# Patient Record
Sex: Female | Born: 1950 | Race: White | Hispanic: No | Marital: Married | Smoking: Former smoker
Health system: Southern US, Community
[De-identification: ages and names within clinical notes are randomized; demographics above are authoritative.]

## PROBLEM LIST (undated history)

## (undated) DIAGNOSIS — C801 Malignant (primary) neoplasm, unspecified: Secondary | ICD-10-CM

## (undated) DIAGNOSIS — M7061 Trochanteric bursitis, right hip: Secondary | ICD-10-CM

## (undated) DIAGNOSIS — R7303 Prediabetes: Secondary | ICD-10-CM

## (undated) DIAGNOSIS — K635 Polyp of colon: Secondary | ICD-10-CM

## (undated) DIAGNOSIS — D649 Anemia, unspecified: Secondary | ICD-10-CM

## (undated) DIAGNOSIS — M7062 Trochanteric bursitis, left hip: Secondary | ICD-10-CM

## (undated) DIAGNOSIS — H269 Unspecified cataract: Secondary | ICD-10-CM

## (undated) DIAGNOSIS — E785 Hyperlipidemia, unspecified: Secondary | ICD-10-CM

## (undated) DIAGNOSIS — M1990 Unspecified osteoarthritis, unspecified site: Secondary | ICD-10-CM

## (undated) HISTORY — DX: Malignant (primary) neoplasm, unspecified: C80.1

## (undated) HISTORY — DX: Unspecified osteoarthritis, unspecified site: M19.90

## (undated) HISTORY — DX: Hyperlipidemia, unspecified: E78.5

## (undated) HISTORY — DX: Polyp of colon: K63.5

## (undated) HISTORY — PX: CHOLECYSTECTOMY: SHX55

## (undated) HISTORY — DX: Unspecified cataract: H26.9

## (undated) HISTORY — PX: COLONOSCOPY: SHX174

## (undated) HISTORY — DX: Prediabetes: R73.03

## (undated) HISTORY — DX: Anemia, unspecified: D64.9

---
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
## 1976-07-29 HISTORY — PX: CERVICAL CONIZATION W/BX: SHX1330

## 2006-07-29 HISTORY — PX: SKIN CANCER DESTRUCTION: SHX778

## 2010-11-15 ENCOUNTER — Emergency Department (HOSPITAL_COMMUNITY)
Admission: EM | Admit: 2010-11-15 | Discharge: 2010-11-15 | Disposition: A | Discharge status: Home / Self Care | Payer: 59 | Attending: Emergency Medicine | Admitting: Emergency Medicine

## 2010-11-15 ENCOUNTER — Emergency Department (HOSPITAL_COMMUNITY): Payer: 59

## 2010-11-15 DIAGNOSIS — K8020 Calculus of gallbladder without cholecystitis without obstruction: Secondary | ICD-10-CM | POA: Insufficient documentation

## 2010-11-15 DIAGNOSIS — E7800 Pure hypercholesterolemia, unspecified: Secondary | ICD-10-CM | POA: Insufficient documentation

## 2010-11-15 DIAGNOSIS — R109 Unspecified abdominal pain: Secondary | ICD-10-CM | POA: Insufficient documentation

## 2010-11-15 LAB — LIPASE, BLOOD: Lipase: 36 U/L (ref 11–59)

## 2010-11-15 LAB — COMPREHENSIVE METABOLIC PANEL
ALT: 22 U/L (ref 0–35)
Albumin: 4 g/dL (ref 3.5–5.2)
Alkaline Phosphatase: 78 U/L (ref 39–117)
Chloride: 107 mEq/L (ref 96–112)
Glucose, Bld: 131 mg/dL — ABNORMAL HIGH (ref 70–99)
Potassium: 4 mEq/L (ref 3.5–5.1)
Sodium: 139 mEq/L (ref 135–145)
Total Protein: 7.1 g/dL (ref 6.0–8.3)

## 2010-11-15 LAB — CBC
MCH: 31.2 pg (ref 26.0–34.0)
MCHC: 35.5 g/dL (ref 30.0–36.0)
MCV: 88 fL (ref 78.0–100.0)
Platelets: 266 10*3/uL (ref 150–400)
RBC: 4.65 MIL/uL (ref 3.87–5.11)
RDW: 12 % (ref 11.5–15.5)

## 2010-11-15 LAB — DIFFERENTIAL
Basophils Relative: 1 % (ref 0–1)
Eosinophils Absolute: 0.2 10*3/uL (ref 0.0–0.7)
Eosinophils Relative: 1 % (ref 0–5)
Lymphs Abs: 2.5 10*3/uL (ref 0.7–4.0)
Monocytes Absolute: 0.9 10*3/uL (ref 0.1–1.0)
Monocytes Relative: 8 % (ref 3–12)

## 2010-11-15 LAB — URINALYSIS, ROUTINE W REFLEX MICROSCOPIC
Bilirubin Urine: NEGATIVE
Ketones, ur: 40 mg/dL — AB
Nitrite: NEGATIVE
Protein, ur: NEGATIVE mg/dL
pH: 5 (ref 5.0–8.0)

## 2010-11-15 LAB — URINE MICROSCOPIC-ADD ON

## 2010-11-26 ENCOUNTER — Other Ambulatory Visit: Payer: Self-pay | Admitting: Surgery

## 2010-11-26 ENCOUNTER — Ambulatory Visit
Admission: RE | Admit: 2010-11-26 | Discharge: 2010-11-26 | Disposition: A | Discharge status: Home / Self Care | Payer: 59 | Source: Ambulatory Visit | Attending: Surgery | Admitting: Surgery

## 2010-11-26 DIAGNOSIS — Z01811 Encounter for preprocedural respiratory examination: Secondary | ICD-10-CM

## 2011-01-28 ENCOUNTER — Encounter: Payer: Self-pay | Admitting: Family Medicine

## 2011-01-28 DIAGNOSIS — E785 Hyperlipidemia, unspecified: Secondary | ICD-10-CM | POA: Insufficient documentation

## 2011-03-19 ENCOUNTER — Other Ambulatory Visit: Payer: Self-pay | Admitting: Family Medicine

## 2011-03-19 ENCOUNTER — Encounter: Payer: Self-pay | Admitting: Gastroenterology

## 2011-03-19 DIAGNOSIS — Z1231 Encounter for screening mammogram for malignant neoplasm of breast: Secondary | ICD-10-CM

## 2011-03-28 ENCOUNTER — Ambulatory Visit (AMBULATORY_SURGERY_CENTER): Payer: 59 | Admitting: *Deleted

## 2011-03-28 ENCOUNTER — Encounter: Payer: Self-pay | Admitting: Gastroenterology

## 2011-03-28 VITALS — Ht 63.0 in | Wt 181.0 lb

## 2011-03-28 DIAGNOSIS — Z1211 Encounter for screening for malignant neoplasm of colon: Secondary | ICD-10-CM

## 2011-03-28 MED ORDER — SUPREP BOWEL PREP KIT 17.5-3.13-1.6 GM/177ML PO SOLN: 1.0000 | ORAL | Status: DC

## 2011-04-03 ENCOUNTER — Telehealth: Payer: Self-pay | Admitting: Gastroenterology

## 2011-04-03 ENCOUNTER — Ambulatory Visit
Admission: RE | Admit: 2011-04-03 | Discharge: 2011-04-03 | Disposition: A | Discharge status: Home / Self Care | Payer: 59 | Source: Ambulatory Visit | Attending: Family Medicine | Admitting: Family Medicine

## 2011-04-03 DIAGNOSIS — Z1231 Encounter for screening mammogram for malignant neoplasm of breast: Secondary | ICD-10-CM

## 2011-04-03 NOTE — Telephone Encounter (Signed)
Pt wants to know if the fact that she has been taking prednisone for poison ivy is going to matter with her upcoming colon. She is on her second round. Colon is scheduled for next week. Please advise.

## 2011-04-04 NOTE — Telephone Encounter (Signed)
No problem.

## 2011-04-04 NOTE — Telephone Encounter (Signed)
Pt aware.

## 2011-04-11 ENCOUNTER — Other Ambulatory Visit: Payer: 59 | Admitting: Gastroenterology

## 2011-04-23 ENCOUNTER — Ambulatory Visit (AMBULATORY_SURGERY_CENTER): Payer: 59 | Admitting: Gastroenterology

## 2011-04-23 ENCOUNTER — Encounter: Payer: Self-pay | Admitting: Gastroenterology

## 2011-04-23 VITALS — BP 155/77 | HR 77 | Temp 97.0°F | Resp 18 | Ht 60.0 in | Wt 181.0 lb

## 2011-04-23 DIAGNOSIS — D126 Benign neoplasm of colon, unspecified: Secondary | ICD-10-CM

## 2011-04-23 DIAGNOSIS — K5730 Diverticulosis of large intestine without perforation or abscess without bleeding: Secondary | ICD-10-CM

## 2011-04-23 DIAGNOSIS — Z1211 Encounter for screening for malignant neoplasm of colon: Secondary | ICD-10-CM

## 2011-04-23 MED ORDER — SODIUM CHLORIDE 0.9 % IV SOLN: 500.0000 mL | INTRAVENOUS | Status: DC

## 2011-04-23 NOTE — Patient Instructions (Addendum)
Diverticulosis Diverticulosis is a common condition that develops when small pouches (diverticula) form in the wall of the colon. The risk of diverticulosis increases with age. It happens more often in people who eat a low-fiber diet. Most individuals with diverticulosis have no symptoms. Those individuals with symptoms usually experience belly (abdominal) pain, constipation, or loose stools (diarrhea). HOME CARE INSTRUCTIONS  Increase the amount of fiber in your diet as directed by your caregiver or dietician. This may reduce symptoms of diverticulosis.   Your caregiver may recommend taking a dietary fiber supplement.   Drink at least 6 to 8 glasses of water each day to prevent constipation.   Try not to strain when you have a bowel movement.   Your caregiver may recommend avoiding nuts and seeds to prevent complications, although this is still an uncertain benefit.   Only take over-the-counter or prescription medicines for pain, discomfort, or fever as directed by your caregiver.  FOODS HAVING HIGH FIBER CONTENT INCLUDE:  Fruits. Apple, peach, pear, tangerine, raisins, prunes.   Vegetables. Brussels sprouts, asparagus, broccoli, cabbage, carrot, cauliflower, romaine lettuce, spinach, summer squash, tomato, winter squash, zucchini.   Starchy Vegetables. Baked beans, kidney beans, lima beans, split peas, lentils, potatoes (with skin).   Grains. Whole wheat bread, brown rice, bran flake cereal, plain oatmeal, white rice, shredded wheat, bran muffins.  SEEK IMMEDIATE MEDICAL CARE IF:  You develop increasing pain or severe bloating.   You have an oral temperature above 100, not controlled by medicine.   You develop vomiting or bowel movements that are bloody or black.  Document Released: 04/11/2004 Document Re-Released: 01/02/2010 [facility] Patient Information 2011 [place], [place]  .Polyps, Colon  A polyp is extra tissue that grows inside your body. Colon polyps grow in the large  intestine. The large intestine, also called the colon, is part of your digestive system. It is a long, hollow tube at the end of your digestive tract where your body makes and stores stool. Most polyps are not dangerous. They are benign. This means they are not cancerous. But over time, some types of polyps can turn into cancer. Polyps that are smaller than a pea are usually not harmful. But larger polyps could someday become or may already be cancerous. To be safe, doctors remove all polyps and test them.  WHO GETS POLYPS? Anyone can get polyps, but certain people are more likely than others. You may have a greater chance of getting polyps if:  You are over 50.   You have had polyps before.   Someone in your family has had polyps.   Someone in your family has had cancer of the large intestine.   Find out if someone in your family has had polyps. You may also be more likely to get polyps if you:   Eat a lot of fatty foods   Smoke   Drink alcohol   Do not exercise  Eat too much  SYMPTOMS Most small polyps do not cause symptoms. People often do not know they have one until their caregiver finds it during a regular checkup or while testing them for something else. Some people do have symptoms like these:  Bleeding from the anus. You might notice blood on your underwear or on toilet paper after you have had a bowel movement.   Constipation or diarrhea that lasts more than a week.   Blood in the stool. Blood can make stool look black or it can show up as red streaks in the stool.  If  you have any of these symptoms, see your caregiver. HOW DOES THE DOCTOR TEST FOR POLYPS? The doctor can use four tests to check for polyps:  Digital rectal exam. The caregiver wears gloves and checks your rectum (the last part of the large intestine) to see if it feels normal. This test would find polyps only in the rectum. Your caregiver may need to do one of the other tests listed below to find polyps  higher up in the intestine.   Barium enema. The caregiver puts a liquid called barium into your rectum before taking x-rays of your large intestine. Barium makes your intestine look white in the pictures. Polyps are dark, so they are easy to see.   Sigmoidoscopy. With this test, the caregiver can see inside your large intestine. A thin flexible tube is placed into your rectum. The device is called a sigmoidoscope, which has a light and a tiny video camera in it. The caregiver uses the sigmoidoscope to look at the last third of your large intestine.   Colonoscopy. This test is like sigmoidoscopy, but the caregiver looks at all of the large intestine. It usually requires sedation. This is the most common method for finding and removing polyps.  TREATMENT  The caregiver will remove the polyp during sigmoidoscopy or colonoscopy. The polyp is then tested for cancer.   If you have had polyps, your caregiver may want you to get tested regularly in the future.  PREVENTION There is not one sure way to prevent polyps. You might be able to lower your risk of getting them if you:  Eat more fruits and vegetables and less fatty food.   Do not smoke.   Avoid alcohol.   Exercise every day.   Lose weight if you are overweight.   Eating more calcium and folate can also lower your risk of getting polyps. Some foods that are rich in calcium are milk, cheese, and broccoli. Some foods that are rich in folate are chickpeas, kidney beans, and spinach.   Aspirin might help prevent polyps. Studies are under way.  Document Released: 04/10/2004 Document Re-Released: 01/02/2010 [facility] Patient Information 2011 [place], [place].  Please review discharge instructions (blue and green sheets)  Review information about polyps, diverticulosis and high fiber diets  Resume your normal medications

## 2011-04-23 NOTE — Progress Notes (Signed)
Pt was uncomfortable while the scope was being advanced.  Medications were titated per th MD's orders.  Pt relaxed when the scope was with drawn from the cecum and rested comfortably on the rest of the way out. maw

## 2011-04-24 ENCOUNTER — Telehealth: Payer: Self-pay | Admitting: *Deleted

## 2011-04-24 NOTE — Telephone Encounter (Signed)

## 2012-03-09 ENCOUNTER — Other Ambulatory Visit: Payer: Self-pay | Admitting: Family Medicine

## 2012-03-09 DIAGNOSIS — Z1231 Encounter for screening mammogram for malignant neoplasm of breast: Secondary | ICD-10-CM

## 2012-04-03 ENCOUNTER — Ambulatory Visit
Admission: RE | Admit: 2012-04-03 | Discharge: 2012-04-03 | Disposition: A | Discharge status: Home / Self Care | Payer: 59 | Source: Ambulatory Visit | Attending: Family Medicine | Admitting: Family Medicine

## 2012-04-03 DIAGNOSIS — Z1231 Encounter for screening mammogram for malignant neoplasm of breast: Secondary | ICD-10-CM

## 2013-04-23 ENCOUNTER — Other Ambulatory Visit: Payer: Self-pay

## 2013-04-23 DIAGNOSIS — Z1231 Encounter for screening mammogram for malignant neoplasm of breast: Secondary | ICD-10-CM

## 2013-05-12 ENCOUNTER — Other Ambulatory Visit: Payer: BLUE CROSS/BLUE SHIELD

## 2013-05-12 DIAGNOSIS — Z0000 Encounter for general adult medical examination without abnormal findings: Secondary | ICD-10-CM

## 2013-05-12 LAB — COMPREHENSIVE METABOLIC PANEL
BUN: 18 mg/dL (ref 6–23)
CO2: 28 mEq/L (ref 19–32)
Creat: 0.83 mg/dL (ref 0.50–1.10)
Glucose, Bld: 134 mg/dL — ABNORMAL HIGH (ref 70–99)
Total Bilirubin: 1.1 mg/dL (ref 0.3–1.2)
Total Protein: 6.9 g/dL (ref 6.0–8.3)

## 2013-05-12 LAB — CBC WITH DIFFERENTIAL/PLATELET
Eosinophils Absolute: 0.1 10*3/uL (ref 0.0–0.7)
Eosinophils Relative: 2 % (ref 0–5)
Hemoglobin: 14.3 g/dL (ref 12.0–15.0)
Lymphs Abs: 2.2 10*3/uL (ref 0.7–4.0)
MCH: 30.3 pg (ref 26.0–34.0)
MCV: 88.6 fL (ref 78.0–100.0)
Monocytes Absolute: 0.4 10*3/uL (ref 0.1–1.0)
Monocytes Relative: 6 % (ref 3–12)
RBC: 4.72 MIL/uL (ref 3.87–5.11)

## 2013-05-12 LAB — LIPID PANEL
Cholesterol: 146 mg/dL (ref 0–200)
Total CHOL/HDL Ratio: 2.6 Ratio
Triglycerides: 114 mg/dL (ref <150)
VLDL: 23 mg/dL (ref 0–40)

## 2013-05-13 ENCOUNTER — Ambulatory Visit
Admission: RE | Admit: 2013-05-13 | Discharge: 2013-05-13 | Disposition: A | Discharge status: Home / Self Care | Payer: BC Managed Care – PPO | Source: Ambulatory Visit

## 2013-05-13 DIAGNOSIS — Z1231 Encounter for screening mammogram for malignant neoplasm of breast: Secondary | ICD-10-CM

## 2013-05-19 ENCOUNTER — Encounter: Payer: Self-pay | Admitting: Family Medicine

## 2013-05-20 ENCOUNTER — Encounter: Payer: Self-pay | Admitting: Family Medicine

## 2013-05-20 ENCOUNTER — Ambulatory Visit (INDEPENDENT_AMBULATORY_CARE_PROVIDER_SITE_OTHER): Payer: BLUE CROSS/BLUE SHIELD | Admitting: Family Medicine

## 2013-05-20 VITALS — BP 130/90 | HR 68 | Temp 97.4°F | Resp 18 | Ht 64.0 in | Wt 196.0 lb

## 2013-05-20 DIAGNOSIS — R739 Hyperglycemia, unspecified: Secondary | ICD-10-CM

## 2013-05-20 DIAGNOSIS — Z0000 Encounter for general adult medical examination without abnormal findings: Secondary | ICD-10-CM

## 2013-05-20 DIAGNOSIS — R7303 Prediabetes: Secondary | ICD-10-CM | POA: Insufficient documentation

## 2013-05-20 DIAGNOSIS — Z23 Encounter for immunization: Secondary | ICD-10-CM

## 2013-05-20 DIAGNOSIS — R7309 Other abnormal glucose: Secondary | ICD-10-CM

## 2013-05-20 DIAGNOSIS — K635 Polyp of colon: Secondary | ICD-10-CM | POA: Insufficient documentation

## 2013-05-20 LAB — HEMOGLOBIN A1C
Hgb A1c MFr Bld: 6 % — ABNORMAL HIGH (ref <5.7)
Mean Plasma Glucose: 126 mg/dL — ABNORMAL HIGH (ref <117)

## 2013-05-20 NOTE — Addendum Note (Signed)
Addended by: Reginia Forts on: 05/20/2013 12:51 PM   Modules accepted: Orders

## 2013-05-20 NOTE — Progress Notes (Signed)
Subjective:    Patient ID: Donna Conner, female    DOB: 17-Nov-1950, 62 y.o.   MRN: [id number]  HPI Patient is a very pleasant 62 year old white female who is here today for complete physical exam. Her last colonoscopy was in 2012. She's been getting them every 3 years due to colon polyps. She is due again in 2012. She is happy Zostavax. Her last tetanus shot was in 2012. Her last Pap smear was in 2011 and is due today. She is also due today for her flu shot. Her most recent labwork as listed below: Lab on 05/12/2013  Component Date Value Range Status  . WBC 05/12/2013 6.2  4.0 - 10.5 K/uL Final  . RBC 05/12/2013 4.72  3.87 - 5.11 MIL/uL Final  . Hemoglobin 05/12/2013 14.3  12.0 - 15.0 g/dL Final  . HCT 65/78/4696 41.8  36.0 - 46.0 % Final  . MCV 05/12/2013 88.6  78.0 - 100.0 fL Final  . MCH 05/12/2013 30.3  26.0 - 34.0 pg Final  . MCHC 05/12/2013 34.2  30.0 - 36.0 g/dL Final  . RDW 29/52/8413 12.7  11.5 - 15.5 % Final  . Platelets 05/12/2013 283  150 - 400 K/uL Final  . Neutrophils Relative % 05/12/2013 56  43 - 77 % Final  . Neutro Abs 05/12/2013 3.5  1.7 - 7.7 K/uL Final  . Lymphocytes Relative 05/12/2013 35  12 - 46 % Final  . Lymphs Abs 05/12/2013 2.2  0.7 - 4.0 K/uL Final  . Monocytes Relative 05/12/2013 6  3 - 12 % Final  . Monocytes Absolute 05/12/2013 0.4  0.1 - 1.0 K/uL Final  . Eosinophils Relative 05/12/2013 2  0 - 5 % Final  . Eosinophils Absolute 05/12/2013 0.1  0.0 - 0.7 K/uL Final  . Basophils Relative 05/12/2013 1  0 - 1 % Final  . Basophils Absolute 05/12/2013 0.1  0.0 - 0.1 K/uL Final  . Smear Review 05/12/2013 Criteria for review not met   Final  . Sodium 05/12/2013 141  135 - 145 mEq/L Final  . Potassium 05/12/2013 4.7  3.5 - 5.3 mEq/L Final  . Chloride 05/12/2013 101  96 - 112 mEq/L Final  . CO2 05/12/2013 28  19 - 32 mEq/L Final  . Glucose, Bld 05/12/2013 134* 70 - 99 mg/dL Final  . BUN 24/40/1027 18  6 - 23 mg/dL Final  . Creat 25/36/6440 0.83  0.50 - 1.10  mg/dL Final  . Total Bilirubin 05/12/2013 1.1  0.3 - 1.2 mg/dL Final  . Alkaline Phosphatase 05/12/2013 81  39 - 117 U/L Final  . AST 05/12/2013 18  0 - 37 U/L Final  . ALT 05/12/2013 19  0 - 35 U/L Final  . Total Protein 05/12/2013 6.9  6.0 - 8.3 g/dL Final  . Albumin 34/74/2595 4.5  3.5 - 5.2 g/dL Final  . Calcium 63/87/5643 9.8  8.4 - 10.5 mg/dL Final  . Cholesterol 32/95/1884 146  0 - 200 mg/dL Final   Comment: ATP III Classification:                                < 200        mg/dL        Desirable                               200 - 239  mg/dL        Borderline High                               >= 240        mg/dL        High                             . Triglycerides 05/12/2013 114  <150 mg/dL Final  . HDL 29/56/2130 57  >39 mg/dL Final  . Total CHOL/HDL Ratio 05/12/2013 2.6   Final  . VLDL 05/12/2013 23  0 - 40 mg/dL Final  . LDL Cholesterol 05/12/2013 66  0 - 99 mg/dL Final   Comment:                            Total Cholesterol/HDL Ratio:CHD Risk                                                 Coronary Heart Disease Risk Table                                                                 Men       Women                                   1/2 Average Risk              3.4        3.3                                       Average Risk              5.0        4.4                                    2X Average Risk              9.6        7.1                                    3X Average Risk             23.4       11.0                          Use the calculated Patient Ratio above and the CHD Risk table                           to determine the patient's  CHD Risk.                          ATP III Classification (LDL):                                < 100        mg/dL         Optimal                               100 - 129     mg/dL         Near or Above Optimal                               130 - 159     mg/dL         Borderline High                               160 -  189     mg/dL         High                                > 190        mg/dL         Very High                             . TSH 05/12/2013 2.344  0.350 - 4.500 uIU/mL Final  . Vit D, 25-Hydroxy 05/12/2013 55  30 - 89 ng/mL Final   Comment: This assay accurately quantifies Vitamin D, which is the sum of the                          25-Hydroxy forms of Vitamin D2 and D3.  Studies have shown that the                          optimum concentration of 25-Hydroxy Vitamin D is 30 ng/mL or higher.                           Concentrations of Vitamin D between 20 and 29 ng/mL are considered to                          be insufficient and concentrations less than 20 ng/mL are considered                          to be deficient for Vitamin D.   Past Medical History  Diagnosis Date  . Hyperlipidemia   . Prediabetes   . Colon polyps    Past Surgical History  Procedure Laterality Date  . Cholecystectomy    . Skin cancer destruction  07/29/06    basal cell  . Cervical conization w/bx  1978  . Tonsillectomy and adenoidectomy  1967   Current Outpatient Prescriptions on File Prior to Visit  Medication Sig Dispense Refill  .  pravastatin (PRAVACHOL) 40 MG tablet Take 40 mg by mouth daily.         No current facility-administered medications on file prior to visit.   No Known Allergies History   Social History  . Marital Status: Married    Spouse Name: N/A    Number of Children: N/A  . Years of Education: N/A   Occupational History  . Not on file.   Social History Main Topics  . Smoking status: Former Games developer  . Smokeless tobacco: Never Used  . Alcohol Use: Yes     Comment: 1-2 wine a month  . Drug Use: No  . Sexual Activity: Yes     Comment: married to [place], retired   Other Topics Concern  . Not on file   Social History Narrative  . No narrative on file   Family history is noncontributory. There is remote family history of diabetes and heart disease   Review of Systems  All  other systems reviewed and are negative.       Objective:   Physical Exam  Vitals reviewed. Constitutional: She is oriented to person, place, and time. She appears well-developed and well-nourished. No distress.  HENT:  Head: Normocephalic and atraumatic.  Right Ear: External ear normal.  Left Ear: External ear normal.  Nose: Nose normal.  Mouth/Throat: Oropharynx is clear and moist. No oropharyngeal exudate.  Eyes: Conjunctivae and EOM are normal. Pupils are equal, round, and reactive to light. Right eye exhibits no discharge. Left eye exhibits no discharge. No scleral icterus.  Neck: Normal range of motion. Neck supple. No JVD present. No tracheal deviation present. No thyromegaly present.  Cardiovascular: Normal rate, regular rhythm, normal heart sounds and intact distal pulses.  Exam reveals no gallop and no friction rub.   No murmur heard. Pulmonary/Chest: Effort normal and breath sounds normal. No stridor. No respiratory distress. She has no wheezes. She has no rales. She exhibits no tenderness.  Abdominal: Soft. Bowel sounds are normal. She exhibits no distension and no mass. There is no tenderness. There is no rebound and no guarding.  Genitourinary: Vagina normal and uterus normal.  Musculoskeletal: Normal range of motion. She exhibits no edema and no tenderness.  Lymphadenopathy:    She has no cervical adenopathy.  Neurological: She is alert and oriented to person, place, and time. She has normal reflexes. She displays normal reflexes. No cranial nerve deficit. She exhibits normal muscle tone. Coordination normal.  Skin: Skin is warm. No rash noted. She is not diaphoretic. No erythema. No pallor.  Psychiatric: She has a normal mood and affect. Her behavior is normal. Judgment and thought content normal.          Assessment & Plan:  1. Routine general medical examination at a health care facility Physical exam today is completely normal. Her labs are excellent except for  a fasting blood sugar that is elevated at 134. I will check a hemoglobin A1c. Further treatment options will be discussed based on results of the hemoglobin A1c. The patient's Pap smear was sent today. She has a history of an MI a Pap smear requiring a code procedure this was performed over 30 years ago. The patient received a flu shot today. Her other cancer prevention immunizations are up-to-date. I asked the patient to check her blood pressure every day. Her blood pressures consistently greater than 140/90 I would recommend medication and I recommend an angiotensin receptor blocker given her history of prediabetes the remainder of her lab work is  excellent.  She had a mammogram last week that was reportedly normal and her breast exam today is completely normal - Pap IG (Image Guided) [facility]  2. Hyperglycemia - Hemoglobin A1c

## 2013-05-20 NOTE — Addendum Note (Signed)
Addended by: Elvina Mattes T on: 05/20/2013 01:58 PM   Modules accepted: Orders

## 2013-05-21 LAB — PAP IG (IMAGE GUIDED)

## 2013-05-24 ENCOUNTER — Telehealth: Payer: Self-pay | Admitting: Family Medicine

## 2013-05-24 MED ORDER — PRAVASTATIN SODIUM 40 MG PO TABS: 40.0000 mg | ORAL_TABLET | Freq: Every day | ORAL | Status: DC

## 2013-05-24 NOTE — Telephone Encounter (Addendum)
Patient needs to have her . Pravachol 40 mg. Patient said that they talked about filling it for the year.    [facility]  [address]  [phone]

## 2013-05-24 NOTE — Telephone Encounter (Signed)
Meds refilled.

## 2013-05-25 ENCOUNTER — Telehealth: Payer: Self-pay | Admitting: Family Medicine

## 2013-05-25 NOTE — Telephone Encounter (Signed)
Patients Rx. Was called in to [facility] [address] Yesterday. She  Only received 30 tablets . She said it was supposed to be for 90 tablets and she wanted it called in to [facility] [address] Nicholes Rough.

## 2013-07-02 ENCOUNTER — Telehealth: Payer: Self-pay | Admitting: Family Medicine

## 2013-07-02 NOTE — Telephone Encounter (Signed)
Prescription Pravastain  40 mg she thought for 90 days but it is 30 day refill Call back [phone]

## 2013-07-06 MED ORDER — PRAVASTATIN SODIUM 40 MG PO TABS: 40.0000 mg | ORAL_TABLET | Freq: Every day | ORAL | Status: DC

## 2013-07-06 NOTE — Telephone Encounter (Signed)
Med refilled for 90 days.  Pt aware via husband.

## 2013-07-09 ENCOUNTER — Other Ambulatory Visit: Payer: Self-pay | Admitting: Family Medicine

## 2013-07-09 MED ORDER — PRAVASTATIN SODIUM 40 MG PO TABS: 40.0000 mg | ORAL_TABLET | Freq: Every day | ORAL | Status: DC

## 2013-07-09 NOTE — Telephone Encounter (Signed)
Change in pharmacy

## 2013-09-03 ENCOUNTER — Ambulatory Visit (INDEPENDENT_AMBULATORY_CARE_PROVIDER_SITE_OTHER): Payer: BLUE CROSS/BLUE SHIELD | Admitting: Family Medicine

## 2013-09-03 ENCOUNTER — Encounter: Payer: Self-pay | Admitting: Family Medicine

## 2013-09-03 VITALS — BP 122/74 | HR 78 | Temp 98.5°F | Resp 16 | Ht 64.5 in | Wt 197.0 lb

## 2013-09-03 DIAGNOSIS — J209 Acute bronchitis, unspecified: Secondary | ICD-10-CM

## 2013-09-03 MED ORDER — HYDROCODONE-HOMATROPINE 5-1.5 MG/5ML PO SYRP: 5.0000 mL | ORAL_SOLUTION | ORAL | Status: DC | PRN

## 2013-09-03 MED ORDER — AZITHROMYCIN 250 MG PO TABS: ORAL_TABLET | ORAL | Status: DC

## 2013-09-03 NOTE — Progress Notes (Signed)
Subjective:    Patient ID: Donna Conner, female    DOB: 10-15-1950, 63 y.o.   MRN: [id number]  HPI Patient is a very pleasant 63 year old white female. She was sick the entire month of January sinus infection consisting of head congestion, sinus pressure, and rhinorrhea. Over the last week she began to feel better. However recently in the last 2 days she's developed a severe cough that is nonproductive. She denies any fevers or chills. She denies any myalgias. She does report generalized fatigue and malaise. She denies any chest pain or pleurisy. She denies any hemoptysis. She denies any wheezing.  She denies any otalgia, sore throat, or rhinorrhea. Past Medical History  Diagnosis Date  . Hyperlipidemia   . Prediabetes   . Colon polyps    Current Outpatient Prescriptions on File Prior to Visit  Medication Sig Dispense Refill  . pravastatin (PRAVACHOL) 40 MG tablet Take 1 tablet (40 mg total) by mouth daily.  90 tablet  1   No current facility-administered medications on file prior to visit.   No Known Allergies History   Social History  . Marital Status: Married    Spouse Name: N/A    Number of Children: N/A  . Years of Education: N/A   Occupational History  . Not on file.   Social History Main Topics  . Smoking status: Former Research scientist (life sciences)  . Smokeless tobacco: Never Used  . Alcohol Use: Yes     Comment: 1-2 wine a month  . Drug Use: No  . Sexual Activity: Yes     Comment: married to Bernie, retired   Other Topics Concern  . Not on file   Social History Narrative  . No narrative on file      Review of Systems  All other systems reviewed and are negative.       Objective:   Physical Exam  Vitals reviewed. Constitutional: She appears well-developed and well-nourished. No distress.  HENT:  Right Ear: Tympanic membrane, external ear and ear canal normal.  Left Ear: Tympanic membrane, external ear and ear canal normal.  Nose: Nose normal. No mucosal edema or  rhinorrhea. Right sinus exhibits no maxillary sinus tenderness and no frontal sinus tenderness. Left sinus exhibits no maxillary sinus tenderness and no frontal sinus tenderness.  Mouth/Throat: Oropharynx is clear and moist. No oropharyngeal exudate.  Eyes: Conjunctivae are normal. Pupils are equal, round, and reactive to light. No scleral icterus.  Neck: Neck supple.  Cardiovascular: Normal rate, regular rhythm and normal heart sounds.   No murmur heard. Pulmonary/Chest: Effort normal and breath sounds normal. No respiratory distress. She has no wheezes. She has no rales.  Lymphadenopathy:    She has no cervical adenopathy.  Skin: She is not diaphoretic.          Assessment & Plan:  1. Acute bronchitis Patient symptoms represent acute bronchitis. I explained to the patient that this is most likely viral in source. I recommended Tylenol for aches and pains and fever, Mucinex DM as needed for cough, Hycodan as needed for severe cough to allow her to get some rest at night, and Sudafed for congestion. I did give the patient a prescription for a Z-Pak with strict instructions not to fill unless her symptoms persist for greater than 10 days or symptoms as a clear worsening including fever 202, chest pain, shortness of breath, or pleurisy. - azithromycin (ZITHROMAX) 250 MG tablet; 2 tabs poqday1, 1 tab poqday 2-5  Dispense: 6 tablet; Refill: 0 - HYDROcodone-homatropine (  HYCODAN) 5-1.5 MG/5ML syrup; Take 5 mLs by mouth every 4 (four) hours as needed for cough.  Dispense: 120 mL; Refill: 0

## 2014-02-04 ENCOUNTER — Encounter: Payer: Self-pay | Admitting: Gastroenterology

## 2014-02-09 ENCOUNTER — Other Ambulatory Visit: Payer: Self-pay | Admitting: Family Medicine

## 2014-02-09 ENCOUNTER — Encounter: Payer: Self-pay | Admitting: Family Medicine

## 2014-02-09 NOTE — Telephone Encounter (Signed)
Refill appropriate and filled per protocol. 

## 2014-04-25 ENCOUNTER — Other Ambulatory Visit: Payer: Self-pay

## 2014-04-25 DIAGNOSIS — Z1231 Encounter for screening mammogram for malignant neoplasm of breast: Secondary | ICD-10-CM

## 2014-04-26 ENCOUNTER — Encounter: Payer: BC Managed Care – PPO | Admitting: Gastroenterology

## 2014-05-19 ENCOUNTER — Ambulatory Visit
Admission: RE | Admit: 2014-05-19 | Discharge: 2014-05-19 | Disposition: A | Discharge status: Home / Self Care | Payer: BC Managed Care – PPO | Source: Ambulatory Visit

## 2014-05-19 DIAGNOSIS — Z1231 Encounter for screening mammogram for malignant neoplasm of breast: Secondary | ICD-10-CM

## 2014-05-24 ENCOUNTER — Other Ambulatory Visit: Payer: BC Managed Care – PPO

## 2014-05-24 DIAGNOSIS — R7303 Prediabetes: Secondary | ICD-10-CM

## 2014-05-24 DIAGNOSIS — Z0000 Encounter for general adult medical examination without abnormal findings: Secondary | ICD-10-CM

## 2014-05-24 DIAGNOSIS — E785 Hyperlipidemia, unspecified: Secondary | ICD-10-CM

## 2014-05-24 DIAGNOSIS — Z79899 Other long term (current) drug therapy: Secondary | ICD-10-CM

## 2014-05-24 LAB — CBC WITH DIFFERENTIAL/PLATELET
Basophils Absolute: 0 10*3/uL (ref 0.0–0.1)
Basophils Relative: 0 % (ref 0–1)
Eosinophils Absolute: 0.2 10*3/uL (ref 0.0–0.7)
Eosinophils Relative: 2 % (ref 0–5)
HCT: 41.7 % (ref 36.0–46.0)
HEMOGLOBIN: 15.1 g/dL — AB (ref 12.0–15.0)
LYMPHS ABS: 1.5 10*3/uL (ref 0.7–4.0)
LYMPHS PCT: 15 % (ref 12–46)
MCH: 31.8 pg (ref 26.0–34.0)
MCHC: 36.2 g/dL — ABNORMAL HIGH (ref 30.0–36.0)
MCV: 87.8 fL (ref 78.0–100.0)
MONOS PCT: 6 % (ref 3–12)
Monocytes Absolute: 0.6 10*3/uL (ref 0.1–1.0)
NEUTROS ABS: 7.5 10*3/uL (ref 1.7–7.7)
NEUTROS PCT: 77 % (ref 43–77)
PLATELETS: 268 10*3/uL (ref 150–400)
RBC: 4.75 MIL/uL (ref 3.87–5.11)
RDW: 12.8 % (ref 11.5–15.5)
WBC: 9.7 10*3/uL (ref 4.0–10.5)

## 2014-05-24 LAB — COMPLETE METABOLIC PANEL WITH GFR
ALT: 28 U/L (ref 0–35)
AST: 24 U/L (ref 0–37)
Albumin: 4.6 g/dL (ref 3.5–5.2)
Alkaline Phosphatase: 95 U/L (ref 39–117)
BUN: 15 mg/dL (ref 6–23)
CALCIUM: 9.5 mg/dL (ref 8.4–10.5)
CHLORIDE: 103 meq/L (ref 96–112)
CO2: 25 meq/L (ref 19–32)
CREATININE: 0.88 mg/dL (ref 0.50–1.10)
GFR, EST AFRICAN AMERICAN: 80 mL/min
GFR, Est Non African American: 70 mL/min
GLUCOSE: 126 mg/dL — AB (ref 70–99)
POTASSIUM: 4.4 meq/L (ref 3.5–5.3)
Sodium: 139 mEq/L (ref 135–145)
Total Bilirubin: 1 mg/dL (ref 0.2–1.2)
Total Protein: 7.1 g/dL (ref 6.0–8.3)

## 2014-05-24 LAB — LIPID PANEL
CHOL/HDL RATIO: 2.7 ratio
CHOLESTEROL: 130 mg/dL (ref 0–200)
HDL: 48 mg/dL (ref >39)
LDL Cholesterol: 58 mg/dL (ref 0–99)
TRIGLYCERIDES: 118 mg/dL (ref <150)
VLDL: 24 mg/dL (ref 0–40)

## 2014-05-24 LAB — TSH: TSH: 1.858 u[IU]/mL (ref 0.350–4.500)

## 2014-05-24 LAB — HEMOGLOBIN A1C
Hgb A1c MFr Bld: 6.1 % — ABNORMAL HIGH (ref <5.7)
Mean Plasma Glucose: 128 mg/dL — ABNORMAL HIGH (ref <117)

## 2014-05-31 ENCOUNTER — Ambulatory Visit (INDEPENDENT_AMBULATORY_CARE_PROVIDER_SITE_OTHER): Payer: BC Managed Care – PPO | Admitting: Family Medicine

## 2014-05-31 ENCOUNTER — Encounter: Payer: Self-pay | Admitting: Family Medicine

## 2014-05-31 VITALS — BP 126/72 | HR 78 | Temp 98.4°F | Resp 14 | Ht 64.5 in | Wt 196.0 lb

## 2014-05-31 DIAGNOSIS — Z0000 Encounter for general adult medical examination without abnormal findings: Secondary | ICD-10-CM

## 2014-05-31 DIAGNOSIS — Z23 Encounter for immunization: Secondary | ICD-10-CM

## 2014-05-31 NOTE — Progress Notes (Signed)
Subjective:    Patient ID: Donna Conner, female    DOB: 10-Aug-1950, 63 y.o.   MRN: [id number]  HPI Patient is a very pleasant 63 year old white female who is here today for complete physical exam. She is due for colonoscopy in December. She is going to schedule that. Her mammogram was just performed in October and was completely normal. Her Pap smear was performed last year. She is not due for a Pap smear until 2017. She is due for a flu shot which she requests a day. She has had a shingles vaccine. There is no family history of osteoporosis and she is not due for a bone density test until age 46. Her most recent lab work as listed below: Lab on 05/24/2014  Component Date Value Ref Range Status  . WBC 05/24/2014 9.7  4.0 - 10.5 K/uL Final  . RBC 05/24/2014 4.75  3.87 - 5.11 MIL/uL Final  . Hemoglobin 05/24/2014 15.1* 12.0 - 15.0 g/dL Final  . HCT 05/24/2014 41.7  36.0 - 46.0 % Final  . MCV 05/24/2014 87.8  78.0 - 100.0 fL Final  . MCH 05/24/2014 31.8  26.0 - 34.0 pg Final  . MCHC 05/24/2014 36.2* 30.0 - 36.0 g/dL Final  . RDW 05/24/2014 12.8  11.5 - 15.5 % Final  . Platelets 05/24/2014 268  150 - 400 K/uL Final  . Neutrophils Relative % 05/24/2014 77  43 - 77 % Final  . Neutro Abs 05/24/2014 7.5  1.7 - 7.7 K/uL Final  . Lymphocytes Relative 05/24/2014 15  12 - 46 % Final  . Lymphs Abs 05/24/2014 1.5  0.7 - 4.0 K/uL Final  . Monocytes Relative 05/24/2014 6  3 - 12 % Final  . Monocytes Absolute 05/24/2014 0.6  0.1 - 1.0 K/uL Final  . Eosinophils Relative 05/24/2014 2  0 - 5 % Final  . Eosinophils Absolute 05/24/2014 0.2  0.0 - 0.7 K/uL Final  . Basophils Relative 05/24/2014 0  0 - 1 % Final  . Basophils Absolute 05/24/2014 0.0  0.0 - 0.1 K/uL Final  . Smear Review 05/24/2014 Criteria for review not met   Final  . Cholesterol 05/24/2014 130  0 - 200 mg/dL Final   Comment: ATP III Classification:                                < 200        mg/dL        Desirable      200 - 239     mg/dL        Borderline High                               >= 240        mg/dL        High                             . Triglycerides 05/24/2014 118  <150 mg/dL Final  . HDL 05/24/2014 48  >39 mg/dL Final  . Total CHOL/HDL Ratio 05/24/2014 2.7   Final  . VLDL 05/24/2014 24  0 - 40 mg/dL Final  . LDL Cholesterol 05/24/2014 58  0 - 99 mg/dL Final   Comment:  Total Cholesterol/HDL Ratio:CHD Risk                                                 Coronary Heart Disease Risk Table                                                                 Men       Women                                   1/2 Average Risk              3.4        3.3                                       Average Risk              5.0        4.4                                    2X Average Risk              9.6        7.1                                    3X Average Risk             23.4       11.0                          Use the calculated Patient Ratio above and the CHD Risk table                           to determine the patient's CHD Risk.                          ATP III Classification (LDL):                                < 100        mg/dL         Optimal                               100 - 129     mg/dL         Near or Above Optimal                               130 - 159       mg/dL         Borderline High                               160 - 189     mg/dL         High                                > 190        mg/dL         Very High                             . TSH 05/24/2014 1.858  0.350 - 4.500 uIU/mL Final  . Hgb A1c MFr Bld 05/24/2014 6.1* <5.7 % Final   Comment:                                                                                                 According to the ADA Clinical Practice Recommendations for 2011, when                          HbA1c is used as a screening test:                                                       >=6.5%   Diagnostic of  Diabetes Mellitus                                     (if abnormal result is confirmed)                                                     5.7-6.4%   Increased risk of developing Diabetes Mellitus                                                     References:Diagnosis and Classification of Diabetes Mellitus,Diabetes                          CZYS,0630,16(WFUXN 1):S62-S69 and Standards of Medical Care in                                  Diabetes - 2011,Diabetes Care,2011,34 (Suppl 1):S11-S61.                             Marland Kitchen  Mean Plasma Glucose 05/24/2014 128* <117 mg/dL Final  . Sodium 05/24/2014 139  135 - 145 mEq/L Final  . Potassium 05/24/2014 4.4  3.5 - 5.3 mEq/L Final  . Chloride 05/24/2014 103  96 - 112 mEq/L Final  . CO2 05/24/2014 25  19 - 32 mEq/L Final  . Glucose, Bld 05/24/2014 126* 70 - 99 mg/dL Final  . BUN 05/24/2014 15  6 - 23 mg/dL Final  . Creat 05/24/2014 0.88  0.50 - 1.10 mg/dL Final  . Total Bilirubin 05/24/2014 1.0  0.2 - 1.2 mg/dL Final  . Alkaline Phosphatase 05/24/2014 95  39 - 117 U/L Final  . AST 05/24/2014 24  0 - 37 U/L Final  . ALT 05/24/2014 28  0 - 35 U/L Final  . Total Protein 05/24/2014 7.1  6.0 - 8.3 g/dL Final  . Albumin 05/24/2014 4.6  3.5 - 5.2 g/dL Final  . Calcium 05/24/2014 9.5  8.4 - 10.5 mg/dL Final  . GFR, Est African American 05/24/2014 80   Final  . GFR, Est Non African American 05/24/2014 70   Final   Comment:                            The estimated GFR is a calculation valid for adults (>=56 years old)                          that uses the CKD-EPI algorithm to adjust for age and sex. It is                            not to be used for children, pregnant women, hospitalized patients,                             patients on dialysis, or with rapidly changing kidney function.                          According to the NKDEP, eGFR >89 is normal, 60-89 shows mild                          impairment, 30-59 shows moderate impairment, 15-29 shows  severe                          impairment and <15 is ESRD.                              Past Medical History  Diagnosis Date  . Hyperlipidemia   . Prediabetes   . Colon polyps    Past Surgical History  Procedure Laterality Date  . Cholecystectomy    . Skin cancer destruction  07/29/06    basal cell  . Cervical conization w/bx  1978  . Tonsillectomy and adenoidectomy  1967   Current Outpatient Prescriptions on File Prior to Visit  Medication Sig Dispense Refill  . pravastatin (PRAVACHOL) 40 MG tablet TAKE 1 TABLET (40 MG TOTAL) BY MOUTH DAILY. 90 tablet 1   No current facility-administered medications on file prior to visit.   No Known Allergies History   Social History  . Marital Status: Married    Spouse Name: N/A    Number  of Children: N/A  . Years of Education: N/A   Occupational History  . Not on file.   Social History Main Topics  . Smoking status: Former Research scientist (life sciences)  . Smokeless tobacco: Never Used  . Alcohol Use: Yes     Comment: 1-2 wine a month  . Drug Use: No  . Sexual Activity: Yes     Comment: married to Ruthven, retired   Other Topics Concern  . Not on file   Social History Narrative   No family history on file. Unfortunately, her sister just died due to complications of diabetes.   Review of Systems  All other systems reviewed and are negative.      Objective:   Physical Exam  Constitutional: She is oriented to person, place, and time. She appears well-developed and well-nourished. No distress.  HENT:  Head: Normocephalic and atraumatic.  Right Ear: External ear normal.  Left Ear: External ear normal.  Nose: Nose normal.  Mouth/Throat: Oropharynx is clear and moist. No oropharyngeal exudate.  Eyes: Conjunctivae and EOM are normal. Pupils are equal, round, and reactive to light. Right eye exhibits no discharge. Left eye exhibits no discharge. No scleral icterus.  Neck: Normal range of motion. Neck supple. No JVD present. No tracheal deviation  present. No thyromegaly present.  Cardiovascular: Normal rate, regular rhythm, normal heart sounds and intact distal pulses.  Exam reveals no gallop and no friction rub.   No murmur heard. Pulmonary/Chest: Effort normal and breath sounds normal. No stridor. No respiratory distress. She has no wheezes. She has no rales. She exhibits no tenderness.  Abdominal: Soft. Bowel sounds are normal. She exhibits no distension and no mass. There is no tenderness. There is no rebound and no guarding.  Musculoskeletal: Normal range of motion. She exhibits no edema or tenderness.  Lymphadenopathy:    She has no cervical adenopathy.  Neurological: She is alert and oriented to person, place, and time. She has normal reflexes. She displays normal reflexes. No cranial nerve deficit. She exhibits normal muscle tone. Coordination normal.  Skin: Skin is warm. No rash noted. She is not diaphoretic. No erythema. No pallor.  Psychiatric: She has a normal mood and affect. Her behavior is normal. Judgment and thought content normal.  Vitals reviewed.         Assessment & Plan:  Routine general medical examination at a health care facility  ppatient's physical exam is completely normal. She remains a prediabetic. She has worked very hard to try to reduce the carbohydrates in her diet. She is also lost some weight. I congratulated her on these efforts. Her cancer screening is up-to-date. Her immunizations are up-to-date. She received the flu vaccine as well as tdap today in clinic. We will perform a bone density test next year. I recommended 1000 mg of calcium a day and 1000 units of vitamin D a day. Recheck fasting lipid panel and hemoglobin A1c in 6 months. Her cholesterol is so good I do believe she can decrease pravastatin to 20 mg a day.

## 2014-05-31 NOTE — Addendum Note (Signed)
Addended by: Shary Decamp B on: 05/31/2014 09:49 AM   Modules accepted: Orders

## 2014-07-29 HISTORY — PX: COLONOSCOPY: SHX174

## 2014-08-03 ENCOUNTER — Encounter: Payer: Self-pay | Admitting: Gastroenterology

## 2014-09-19 ENCOUNTER — Other Ambulatory Visit: Payer: Self-pay | Admitting: Family Medicine

## 2014-09-23 ENCOUNTER — Ambulatory Visit (AMBULATORY_SURGERY_CENTER): Payer: Self-pay

## 2014-09-23 VITALS — Ht 64.0 in | Wt 200.0 lb

## 2014-09-23 DIAGNOSIS — Z860100 Personal history of colon polyps, unspecified: Secondary | ICD-10-CM

## 2014-09-23 MED ORDER — MOVIPREP 100 G PO SOLR: 1.0000 | Freq: Once | ORAL | Status: DC

## 2014-09-23 NOTE — Progress Notes (Signed)
No allergies to eggs or soy No home oxygen No diet/weight loss meds No past problems with anesthesia  Has email  Emmi instructions given for colonoscopy 

## 2014-10-10 ENCOUNTER — Encounter: Payer: Self-pay | Admitting: Gastroenterology

## 2014-10-27 ENCOUNTER — Ambulatory Visit (AMBULATORY_SURGERY_CENTER): Payer: Medicare Other | Admitting: Gastroenterology

## 2014-10-27 ENCOUNTER — Encounter: Payer: Self-pay | Admitting: Gastroenterology

## 2014-10-27 VITALS — BP 122/78 | HR 66 | Temp 97.8°F | Resp 20 | Ht 64.0 in | Wt 200.0 lb

## 2014-10-27 DIAGNOSIS — Z86010 Personal history of colonic polyps: Secondary | ICD-10-CM

## 2014-10-27 DIAGNOSIS — K648 Other hemorrhoids: Secondary | ICD-10-CM | POA: Diagnosis not present

## 2014-10-27 DIAGNOSIS — K5730 Diverticulosis of large intestine without perforation or abscess without bleeding: Secondary | ICD-10-CM

## 2014-10-27 DIAGNOSIS — D122 Benign neoplasm of ascending colon: Secondary | ICD-10-CM

## 2014-10-27 DIAGNOSIS — D123 Benign neoplasm of transverse colon: Secondary | ICD-10-CM | POA: Diagnosis not present

## 2014-10-27 MED ORDER — SODIUM CHLORIDE 0.9 % IV SOLN: 500.0000 mL | INTRAVENOUS | Status: DC

## 2014-10-27 NOTE — Op Note (Signed)
[facility]  [address] [place], [address]   COLONOSCOPY PROCEDURE REPORT  PATIENT: Donna Conner, Donna Conner  MR#: [id number] BIRTHDATE: May 06, 1951 , 58  yrs. old GENDER: female ENDOSCOPIST: Inda Castle, MD REFERRED NU:[id number] Dennard Schaumann, M.D. PROCEDURE DATE:  10/27/2014 PROCEDURE:   Colonoscopy, surveillance and Colonoscopy with cold biopsy polypectomy First Screening Colonoscopy - Avg.  risk and is 50 yrs.  old or older - No.  Prior Negative Screening - Now for repeat screening. N/A  History of Adenoma - Now for follow-up colonoscopy & has been > or = to 3 yrs.  Yes hx of adenoma.  Has been 3 or more years since last colonoscopy. ASA CLASS:   Class II INDICATIONS:PH Colon Adenoma.   2013 Multiple adenomatous polyps MEDICATIONS: Monitored anesthesia care and Propofol 240 mg IV  DESCRIPTION OF PROCEDURE:   After the risks benefits and alternatives of the procedure were thoroughly explained, informed consent was obtained.  The digital rectal exam revealed no abnormalities of the rectum.   The [facility] [id number]  endoscope was introduced through the anus and advanced to the cecum, which was identified by both the appendix and ileocecal valve. No adverse events experienced.   The quality of the prep was (Suprep was used) excellent.  The instrument was then slowly withdrawn as the colon was fully examined.      COLON FINDINGS: There was moderate diverticulosis noted in the sigmoid colon.   A sessile polyp measuring 2 mm in size was found in the ascending colon.  A polypectomy was performed with cold forceps.   A sessile polyp measuring 2 mm in size was found at the hepatic flexure.  A polypectomy was performed with cold forceps. Internal hemorrhoids were found.  Retroflexed views revealed no abnormalities. The time to cecum = 4.9 Withdrawal time = 9.4   The scope was withdrawn and the procedure completed. COMPLICATIONS: There were no immediate  complications.  ENDOSCOPIC IMPRESSION: 1.   Moderate diverticulosis was noted in the sigmoid colon 2.   Sessile polyp was found in the ascending colon; polypectomy was performed with cold forceps 3.   Sessile polyp was found at the hepatic flexure; polypectomy was performed with cold forceps 4.   Internal hemorrhoids  RECOMMENDATIONS: If the polyp(s) removed today are proven to be adenomatous (pre-cancerous) polyps, you will need a repeat colonoscopy in 5 years.  Otherwise you should continue to follow colorectal cancer screening guidelines for "routine risk" patients with colonoscopy in 10 years.  You will receive a letter within 1-2 weeks with the results of your biopsy as well as final recommendations.  Please call my office if you have not received a letter after 3 weeks.  eSigned:  Inda Castle, MD 10/27/2014 11:43 AM   cc:   PATIENT NAME:  Azlin, Zilberman MR#: [id number]

## 2014-10-27 NOTE — Progress Notes (Signed)
Report to PACU, RN, vss, BBS= Clear.  

## 2014-10-27 NOTE — Progress Notes (Signed)
Called to room to assist during endoscopic procedure.  Patient ID and intended procedure confirmed with present staff. Received instructions for my participation in the procedure from the performing physician.  

## 2014-10-27 NOTE — Patient Instructions (Signed)
YOU HAD AN ENDOSCOPIC PROCEDURE TODAY AT THE [facility]:   Refer to the procedure report that was given to you for any specific questions about what was found during the examination.  If the procedure report does not answer your questions, please call your gastroenterologist to clarify.  If you requested that your care partner not be given the details of your procedure findings, then the procedure report has been included in a sealed envelope for you to review at your convenience later.  YOU SHOULD EXPECT: Some feelings of bloating in the abdomen. Passage of more gas than usual.  Walking can help get rid of the air that was put into your GI tract during the procedure and reduce the bloating. If you had a lower endoscopy (such as a colonoscopy or flexible sigmoidoscopy) you may notice spotting of blood in your stool or on the toilet paper. If you underwent a bowel prep for your procedure, you may not have a normal bowel movement for a few days.  Please Note:  You might notice some irritation and congestion in your nose or some drainage.  This is from the oxygen used during your procedure.  There is no need for concern and it should clear up in a day or so.  SYMPTOMS TO REPORT IMMEDIATELY:   Following lower endoscopy (colonoscopy or flexible sigmoidoscopy):  Excessive amounts of blood in the stool  Significant tenderness or worsening of abdominal pains  Swelling of the abdomen that is new, acute  Fever of 100F or higher    For urgent or emergent issues, a gastroenterologist can be reached at any hour by calling [phone].   DIET: Your first meal following the procedure should be a small meal and then it is ok to progress to your normal diet. Heavy or fried foods are harder to digest and may make you feel nauseous or bloated.  Likewise, meals heavy in dairy and vegetables can increase bloating.  Drink plenty of fluids but you should avoid alcoholic beverages for 24  hours.  ACTIVITY:  You should plan to take it easy for the rest of today and you should NOT DRIVE or use heavy machinery until tomorrow (because of the sedation medicines used during the test).    FOLLOW UP: Our staff will call the number listed on your records the next business day following your procedure to check on you and address any questions or concerns that you may have regarding the information given to you following your procedure. If we do not reach you, we will leave a message.  However, if you are feeling well and you are not experiencing any problems, there is no need to return our call.  We will assume that you have returned to your regular daily activities without incident.  If any biopsies were taken you will be contacted by phone or by letter within the next 1-3 weeks.  Please call us at [phone] if you have not heard about the biopsies in 3 weeks.    SIGNATURES/CONFIDENTIALITY: You and/or your care partner have signed paperwork which will be entered into your electronic medical record.  These signatures attest to the fact that that the information above on your After Visit Summary has been reviewed and is understood.  Full responsibility of the confidentiality of this discharge information lies with you and/or your care-partner.   Resume medications. Information given on polyps, diverticulosis and high fiber diet with discharge instructions. 

## 2014-10-28 ENCOUNTER — Telehealth: Payer: Self-pay | Admitting: *Deleted

## 2014-10-28 NOTE — Telephone Encounter (Signed)
  Follow up Call-  Call back number 10/27/2014  Post procedure Call Back phone  # [phone] hm  Permission to leave phone message Yes     Patient questions:  Do you have a fever, pain , or abdominal swelling? No. Pain Score  0 *  Have you tolerated food without any problems? Yes.    Have you been able to return to your normal activities? Yes.    Do you have any questions about your discharge instructions: Diet   No. Medications  No. Follow up visit  No.  Do you have questions or concerns about your Care? No.  Actions: * If pain score is 4 or above: No action needed, pain <4.

## 2014-11-01 ENCOUNTER — Encounter: Payer: Self-pay | Admitting: Gastroenterology

## 2015-01-02 ENCOUNTER — Encounter: Payer: Self-pay | Admitting: Family Medicine

## 2015-01-02 ENCOUNTER — Ambulatory Visit (INDEPENDENT_AMBULATORY_CARE_PROVIDER_SITE_OTHER): Payer: Medicare Other | Admitting: Family Medicine

## 2015-01-02 VITALS — BP 132/76 | HR 78 | Temp 98.3°F | Resp 14 | Ht 64.5 in | Wt 199.0 lb

## 2015-01-02 DIAGNOSIS — L237 Allergic contact dermatitis due to plants, except food: Secondary | ICD-10-CM | POA: Diagnosis not present

## 2015-01-02 MED ORDER — METHYLPREDNISOLONE 4 MG PO TBPK: ORAL_TABLET | ORAL | Status: DC

## 2015-01-02 MED ORDER — METHYLPREDNISOLONE ACETATE 40 MG/ML IJ SUSP
40.0000 mg | Freq: Once | INTRAMUSCULAR | Status: AC
  Administered 2015-01-02: 40 mg via INTRAMUSCULAR

## 2015-01-02 NOTE — Addendum Note (Signed)
Addended by: Sheral Flow on: 01/02/2015 02:45 PM   Modules accepted: Orders

## 2015-01-02 NOTE — Patient Instructions (Signed)
Depo Medrol injection given Start steroid pills tomorrow Call for any concerns F/U as needed

## 2015-01-02 NOTE — Progress Notes (Signed)
Patient ID: Donna Conner, female   DOB: Sep 19, 1950, 64 y.o.   MRN: [id number]   Subjective:    Patient ID: Donna Conner, female    DOB: Oct 08, 1950, 64 y.o.   MRN: [id number]  Patient presents for Posion Donna Conner  Patient here was approximately 1 week ago. She has tried OTC meds with no improvement, was cutting down some brush when she was exposed, has had multiple times in the past but this is not improving and still tingling and itchy. Has spots on her arm and her leg   Review Of Systems:  GEN- denies fatigue, fever, weight loss,weakness, recent illness HEENT- denies eye drainage, change in vision, nasal discharge, CVS- denies chest pain, palpitations RESP- denies SOB, cough, wheeze ABD- denies N/V, change in stools, abd pain GU- denies dysuria, hematuria, dribbling, incontinence MSK- denies joint pain, muscle aches, injury Neuro- denies headache, dizziness, syncope, seizure activity       Objective:    BP 132/76 mmHg  Pulse 78  Temp(Src) 98.3 F (36.8 C) (Oral)  Resp 14  Ht 5' 4.5" (1.638 m)  Wt 199 lb (90.266 kg)  BMI 33.64 kg/m2 GEN- NAD, alert and oriented x3 HEENT-L, EOMI, non injected sclera, pink conjunctiva, MMM, oropharynx clear Skin- erythematous blistering lesions in liner form right forearm, scattered maculpapular lesions in anticubital fossa right arm, rosacea bilat cheeks Pulses- Radial 2+        Assessment & Plan:      Problem List Items Addressed This Visit    None    Visit Diagnoses    Poison ivy dermatitis    -  Primary    Depo Medrol IM given, medrol dosepak, discussed SE of medication       Note: This dictation was prepared with Dragon dictation along with smaller phrase technology. Any transcriptional errors that result from this process are unintentional.

## 2015-01-02 NOTE — Addendum Note (Signed)
Addended by: Sheral Flow on: 01/02/2015 03:36 PM   Modules accepted: Orders

## 2015-02-01 ENCOUNTER — Other Ambulatory Visit: Payer: Self-pay | Admitting: Family Medicine

## 2015-02-01 MED ORDER — PRAVASTATIN SODIUM 20 MG PO TABS: 20.0000 mg | ORAL_TABLET | Freq: Every day | ORAL | Status: DC

## 2015-02-01 NOTE — Telephone Encounter (Signed)
Medication refilled per protocol. 

## 2015-02-02 ENCOUNTER — Other Ambulatory Visit: Payer: Self-pay | Admitting: Family Medicine

## 2015-02-15 ENCOUNTER — Other Ambulatory Visit: Payer: Self-pay | Admitting: Family Medicine

## 2015-02-15 MED ORDER — PRAVASTATIN SODIUM 20 MG PO TABS: 20.0000 mg | ORAL_TABLET | Freq: Every day | ORAL | Status: DC

## 2015-02-15 NOTE — Telephone Encounter (Signed)
Medication refilled per protocol. 

## 2015-04-18 ENCOUNTER — Encounter: Payer: Self-pay | Admitting: Family Medicine

## 2015-05-01 ENCOUNTER — Other Ambulatory Visit: Payer: Self-pay

## 2015-05-01 DIAGNOSIS — Z1231 Encounter for screening mammogram for malignant neoplasm of breast: Secondary | ICD-10-CM

## 2015-05-22 ENCOUNTER — Ambulatory Visit
Admission: RE | Admit: 2015-05-22 | Discharge: 2015-05-22 | Disposition: A | Discharge status: Home / Self Care | Payer: Medicare Other | Source: Ambulatory Visit

## 2015-05-22 DIAGNOSIS — Z1231 Encounter for screening mammogram for malignant neoplasm of breast: Secondary | ICD-10-CM | POA: Diagnosis not present

## 2015-06-08 ENCOUNTER — Other Ambulatory Visit: Payer: Medicare Other

## 2015-06-08 DIAGNOSIS — Z0000 Encounter for general adult medical examination without abnormal findings: Secondary | ICD-10-CM

## 2015-06-08 DIAGNOSIS — R7303 Prediabetes: Secondary | ICD-10-CM

## 2015-06-08 DIAGNOSIS — E119 Type 2 diabetes mellitus without complications: Secondary | ICD-10-CM

## 2015-06-08 DIAGNOSIS — E785 Hyperlipidemia, unspecified: Secondary | ICD-10-CM

## 2015-06-08 LAB — COMPLETE METABOLIC PANEL WITH GFR
ALBUMIN: 4.2 g/dL (ref 3.6–5.1)
ALK PHOS: 81 U/L (ref 33–130)
ALT: 24 U/L (ref 6–29)
AST: 21 U/L (ref 10–35)
BUN: 15 mg/dL (ref 7–25)
CALCIUM: 9.4 mg/dL (ref 8.6–10.4)
CHLORIDE: 104 mmol/L (ref 98–110)
CO2: 29 mmol/L (ref 20–31)
CREATININE: 0.67 mg/dL (ref 0.50–0.99)
GFR, Est Non African American: >89 mL/min (ref >=60)
Glucose, Bld: 148 mg/dL — ABNORMAL HIGH (ref 70–99)
POTASSIUM: 4.3 mmol/L (ref 3.5–5.3)
Sodium: 141 mmol/L (ref 135–146)
Total Bilirubin: 1.2 mg/dL (ref 0.2–1.2)
Total Protein: 6.6 g/dL (ref 6.1–8.1)

## 2015-06-08 LAB — LIPID PANEL
CHOL/HDL RATIO: 2.5 ratio (ref <=5.0)
CHOLESTEROL: 126 mg/dL (ref 125–200)
HDL: 50 mg/dL (ref >=46)
LDL Cholesterol: 55 mg/dL (ref <130)
TRIGLYCERIDES: 106 mg/dL (ref <150)
VLDL: 21 mg/dL (ref <30)

## 2015-06-08 LAB — CBC WITH DIFFERENTIAL/PLATELET
Basophils Absolute: 0.1 10*3/uL (ref 0.0–0.1)
Basophils Relative: 1 % (ref 0–1)
EOS PCT: 3 % (ref 0–5)
Eosinophils Absolute: 0.2 10*3/uL (ref 0.0–0.7)
HEMATOCRIT: 40.3 % (ref 36.0–46.0)
Hemoglobin: 14.1 g/dL (ref 12.0–15.0)
LYMPHS ABS: 1.9 10*3/uL (ref 0.7–4.0)
LYMPHS PCT: 33 % (ref 12–46)
MCH: 30.5 pg (ref 26.0–34.0)
MCHC: 35 g/dL (ref 30.0–36.0)
MCV: 87.2 fL (ref 78.0–100.0)
MPV: 9.1 fL (ref 8.6–12.4)
Monocytes Absolute: 0.4 10*3/uL (ref 0.1–1.0)
Monocytes Relative: 7 % (ref 3–12)
NEUTROS ABS: 3.3 10*3/uL (ref 1.7–7.7)
Neutrophils Relative %: 56 % (ref 43–77)
Platelets: 298 10*3/uL (ref 150–400)
RBC: 4.62 MIL/uL (ref 3.87–5.11)
RDW: 12.7 % (ref 11.5–15.5)
WBC: 5.9 10*3/uL (ref 4.0–10.5)

## 2015-06-08 LAB — HEMOGLOBIN A1C
Hgb A1c MFr Bld: 6.3 % — ABNORMAL HIGH (ref <5.7)
MEAN PLASMA GLUCOSE: 134 mg/dL — AB (ref <117)

## 2015-06-08 LAB — TSH: TSH: 1.604 u[IU]/mL (ref 0.350–4.500)

## 2015-06-19 ENCOUNTER — Encounter: Payer: Self-pay | Admitting: Family Medicine

## 2015-06-19 ENCOUNTER — Ambulatory Visit (INDEPENDENT_AMBULATORY_CARE_PROVIDER_SITE_OTHER): Payer: Medicare Other | Admitting: Family Medicine

## 2015-06-19 VITALS — BP 130/78 | HR 80 | Temp 98.6°F | Resp 18 | Ht 64.5 in | Wt 202.0 lb

## 2015-06-19 DIAGNOSIS — Z0000 Encounter for general adult medical examination without abnormal findings: Secondary | ICD-10-CM | POA: Diagnosis not present

## 2015-06-19 DIAGNOSIS — Z23 Encounter for immunization: Secondary | ICD-10-CM

## 2015-06-19 NOTE — Progress Notes (Signed)
Subjective:    Patient ID: Donna Conner, female    DOB: [birth year], 64 y.o.   MRN: [id number]  HPI  Patient is a very pleasant 64 year old white female who is here today for complete physical exam. She had a colonoscopy in March which was significant for 2 sessile polyps. She is due for repeat colonoscopy in 5 years . Her mammogram was just performed in October and was completely normal. Her Pap smear was performed 2 years ago. She is not due for a Pap smear until 2017. She is due for a flu shot which she requests a day. She has had a shingles vaccine. She is due for Pneumovax 23 Her most recent lab work as listed below: Lab on 06/08/2015  Component Date Value Ref Range Status  . Cholesterol 06/08/2015 126  125 - 200 mg/dL Final  . Triglycerides 06/08/2015 106  <150 mg/dL Final  . HDL 06/08/2015 50  >=46 mg/dL Final  . Total CHOL/HDL Ratio 06/08/2015 2.5  <=5.0 Ratio Final  . VLDL 06/08/2015 21  <30 mg/dL Final  . LDL Cholesterol 06/08/2015 55  <130 mg/dL Final   Comment:   Total Cholesterol/HDL Ratio:CHD Risk                        Coronary Heart Disease Risk Table                                        Men       Women          1/2 Average Risk              3.4        3.3              Average Risk              5.0        4.4           2X Average Risk              9.6        7.1           3X Average Risk             23.4       11.0 Use the calculated Patient Ratio above and the CHD Risk table  to determine the patient's CHD Risk.   Marland Kitchen TSH 06/08/2015 1.604  0.350 - 4.500 uIU/mL Final  . WBC 06/08/2015 5.9  4.0 - 10.5 K/uL Final  . RBC 06/08/2015 4.62  3.87 - 5.11 MIL/uL Final  . Hemoglobin 06/08/2015 14.1  12.0 - 15.0 g/dL Final  . HCT 06/08/2015 40.3  36.0 - 46.0 % Final  . MCV 06/08/2015 87.2  78.0 - 100.0 fL Final  . MCH 06/08/2015 30.5  26.0 - 34.0 pg Final  . MCHC 06/08/2015 35.0  30.0 - 36.0 g/dL Final  . RDW 06/08/2015 12.7  11.5 - 15.5 % Final  . Platelets 06/08/2015 298   150 - 400 K/uL Final  . MPV 06/08/2015 9.1  8.6 - 12.4 fL Final  . Neutrophils Relative % 06/08/2015 56  43 - 77 % Final  . Neutro Abs 06/08/2015 3.3  1.7 - 7.7 K/uL Final  . Lymphocytes Relative 06/08/2015 33  12 - 46 % Final  . Lymphs Abs 06/08/2015 1.9  0.7 - 4.0 K/uL Final  . Monocytes Relative 06/08/2015 7  3 - 12 % Final  . Monocytes Absolute 06/08/2015 0.4  0.1 - 1.0 K/uL Final  . Eosinophils Relative 06/08/2015 3  0 - 5 % Final  . Eosinophils Absolute 06/08/2015 0.2  0.0 - 0.7 K/uL Final  . Basophils Relative 06/08/2015 1  0 - 1 % Final  . Basophils Absolute 06/08/2015 0.1  0.0 - 0.1 K/uL Final  . Smear Review 06/08/2015 Criteria for review not met   Final  . Sodium 06/08/2015 141  135 - 146 mmol/L Final  . Potassium 06/08/2015 4.3  3.5 - 5.3 mmol/L Final  . Chloride 06/08/2015 104  98 - 110 mmol/L Final  . CO2 06/08/2015 29  20 - 31 mmol/L Final  . Glucose, Bld 06/08/2015 148* 70 - 99 mg/dL Final  . BUN 06/08/2015 15  7 - 25 mg/dL Final  . Creat 06/08/2015 0.67  0.50 - 0.99 mg/dL Final  . Total Bilirubin 06/08/2015 1.2  0.2 - 1.2 mg/dL Final  . Alkaline Phosphatase 06/08/2015 81  33 - 130 U/L Final  . AST 06/08/2015 21  10 - 35 U/L Final  . ALT 06/08/2015 24  6 - 29 U/L Final  . Total Protein 06/08/2015 6.6  6.1 - 8.1 g/dL Final  . Albumin 06/08/2015 4.2  3.6 - 5.1 g/dL Final  . Calcium 06/08/2015 9.4  8.6 - 10.4 mg/dL Final  . GFR, Est African American 06/08/2015 >89  >=60 mL/min Final  . GFR, Est Non African American 06/08/2015 >89  >=60 mL/min Final   Comment:   The estimated GFR is a calculation valid for adults (>=26 years old) that uses the CKD-EPI algorithm to adjust for age and sex. It is   not to be used for children, pregnant women, hospitalized patients,    patients on dialysis, or with rapidly changing kidney function. According to the NKDEP, eGFR >89 is normal, 60-89 shows mild impairment, 30-59 shows moderate impairment, 15-29 shows severe impairment and <15  is ESRD.     Marland Kitchen Hgb A1c MFr Bld 06/08/2015 6.3* <5.7 % Final   Comment:                                                                        According to the ADA Clinical Practice Recommendations for 2011, when HbA1c is used as a screening test:     >=6.5%   Diagnostic of Diabetes Mellitus            (if abnormal result is confirmed)   5.7-6.4%   Increased risk of developing Diabetes Mellitus   References:Diagnosis and Classification of Diabetes Mellitus,Diabetes [id number],6599,35(TSVXB 1):S62-S69 and Standards of Medical Care in         Diabetes - 2011,Diabetes [id number],3009,23 (Suppl 1):S11-S61.     . Mean Plasma Glucose 06/08/2015 134* <117 mg/dL Final   Past Medical History  Diagnosis Date  . Hyperlipidemia   . Prediabetes   . Colon polyps   . Anemia   . Arthritis     right hand  . 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 Cholecystectomy    .  Skin cancer destruction  07/29/06    basal cell  . Cervical conization w/bx  1978  . Tonsillectomy and adenoidectomy  1967  . Colonoscopy     Current Outpatient Prescriptions on File Prior to Visit  Medication Sig Dispense Refill  . pravastatin (PRAVACHOL) 20 MG tablet Take 1 tablet (20 mg total) by mouth daily. 90 tablet 1   No current facility-administered medications on file prior to visit.   No Known Allergies Social History   Social History  . Marital Status: Married    Spouse Name: N/A  . Number of Children: N/A  . Years of Education: N/A   Occupational History  . Not on file.   Social History Main Topics  . Smoking status: Former Smoker    Quit date: 10/27/2003  . Smokeless tobacco: Never Used  . Alcohol Use: Yes     Comment: 1-2 wine a month  . Drug Use: No  . Sexual Activity: Yes     Comment: married to [place], retired   Other Topics Concern  . Not on file   Social History Narrative   Family History  Problem Relation Age of Onset  .  Colon cancer Neg Hx   . Esophageal cancer Neg Hx   . Rectal cancer Neg Hx   . Stomach cancer Neg Hx    Unfortunately, her sister just died due to complications of diabetes.   Review of Systems  All other systems reviewed and are negative.      Objective:   Physical Exam  Constitutional: She is oriented to person, place, and time. She appears well-developed and well-nourished. No distress.  HENT:  Head: Normocephalic and atraumatic.  Right Ear: External ear normal.  Left Ear: External ear normal.  Nose: Nose normal.  Mouth/Throat: Oropharynx is clear and moist. No oropharyngeal exudate.  Eyes: Conjunctivae and EOM are normal. Pupils are equal, round, and reactive to light. Right eye exhibits no discharge. Left eye exhibits no discharge. No scleral icterus.  Neck: Normal range of motion. Neck supple. No JVD present. No tracheal deviation present. No thyromegaly present.  Cardiovascular: Normal rate, regular rhythm, normal heart sounds and intact distal pulses.  Exam reveals no gallop and no friction rub.   No murmur heard. Pulmonary/Chest: Effort normal and breath sounds normal. No stridor. No respiratory distress. She has no wheezes. She has no rales. She exhibits no tenderness.  Abdominal: Soft. Bowel sounds are normal. She exhibits no distension and no mass. There is no tenderness. There is no rebound and no guarding.  Musculoskeletal: Normal range of motion. She exhibits no edema or tenderness.  Lymphadenopathy:    She has no cervical adenopathy.  Neurological: She is alert and oriented to person, place, and time. She has normal reflexes. No cranial nerve deficit. She exhibits normal muscle tone. Coordination normal.  Skin: Skin is warm. No rash noted. She is not diaphoretic. No erythema. No pallor.  Psychiatric: She has a normal mood and affect. Her behavior is normal. Judgment and thought content normal.  Vitals reviewed.         Assessment & Plan:  Routine general  medical examination at a health care facility  We had a long discussion today regarding a low carbohydrate diet to address her prediabetes. I would like her to discontinue pravastatin as her lab work is excellent. Recheck a hemoglobin A1c along with fasting lipid panel in 6 months. I would like her to try to lose 10 pounds in that time. She received her  flu shot today as well as Pneumovax 23. I'll perform a bone density next year. Mammogram, colonoscopy, and Pap smear up-to-date.

## 2015-06-19 NOTE — Addendum Note (Signed)
Addended by: Shary Decamp B on: 06/19/2015 10:26 AM   Modules accepted: Orders

## 2015-07-11 ENCOUNTER — Encounter: Payer: Self-pay | Admitting: *Deleted

## 2015-07-21 NOTE — Addendum Note (Signed)
Addended by: Jenna Luo on: 07/21/2015 07:34 AM   Modules accepted: Level of Service

## 2015-07-21 NOTE — Progress Notes (Signed)
   Subjective:    Patient ID: Donna Conner, female    DOB: [birth year], 64 y.o.   MRN: [id number]  HPI    Review of Systems     Objective:   Physical Exam        Assessment & Plan:  Was coded incorrectly.  Should have been coded [id number]

## 2015-12-27 DIAGNOSIS — K529 Noninfective gastroenteritis and colitis, unspecified: Secondary | ICD-10-CM | POA: Diagnosis not present

## 2016-01-04 ENCOUNTER — Ambulatory Visit (INDEPENDENT_AMBULATORY_CARE_PROVIDER_SITE_OTHER): Payer: Medicare Other | Admitting: Family Medicine

## 2016-01-04 ENCOUNTER — Encounter: Payer: Self-pay | Admitting: Family Medicine

## 2016-01-04 VITALS — BP 128/72 | HR 73 | Temp 98.2°F | Resp 17 | Ht 65.5 in | Wt 185.0 lb

## 2016-01-04 DIAGNOSIS — R7303 Prediabetes: Secondary | ICD-10-CM | POA: Diagnosis not present

## 2016-01-04 DIAGNOSIS — R634 Abnormal weight loss: Secondary | ICD-10-CM

## 2016-01-04 DIAGNOSIS — E785 Hyperlipidemia, unspecified: Secondary | ICD-10-CM | POA: Diagnosis not present

## 2016-01-04 DIAGNOSIS — R197 Diarrhea, unspecified: Secondary | ICD-10-CM | POA: Diagnosis not present

## 2016-01-04 MED ORDER — ONDANSETRON HCL 4 MG PO TABS: 4.0000 mg | ORAL_TABLET | ORAL | Status: DC | PRN

## 2016-01-04 MED ORDER — ONDANSETRON HCL 4 MG PO TABS: 4.0000 mg | ORAL_TABLET | Freq: Three times a day (TID) | ORAL | Status: DC | PRN

## 2016-01-04 NOTE — Progress Notes (Signed)
   Subjective:    Patient ID: Donna Conner, female    DOB: 01-07-1951, 65 y.o.   MRN: [id number]  HPI At the last visit, the patient was diagnosed with prediabetes and hyperlipidemia. She subsequently underwent drastic weight loss almost 20 pounds in under 5 weeks at an outpatient weight loss center taking "colon cleansers" and SL HCG.  she subsequently developed severe watery diarrhea causing her to have crampy abdominal pain severe fatigue associated with weight loss. She stopped the weight loss program because she felt so poorly and went to an urgent care last week where they recommended a probiotic and Zofran. She is already starting to feel better. Past Medical History  Diagnosis Date  . Hyperlipidemia   . Prediabetes   . Colon polyps   . Anemia   . Arthritis     right hand  . 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 Cholecystectomy    . Skin cancer destruction  07/29/06    basal cell  . Cervical conization w/bx  1978  . Tonsillectomy and adenoidectomy  1967  . Colonoscopy     Current Outpatient Prescriptions on File Prior to Visit  Medication Sig Dispense Refill  . pravastatin (PRAVACHOL) 20 MG tablet Take 1 tablet (20 mg total) by mouth daily. 90 tablet 1   No current facility-administered medications on file prior to visit.   No Known Allergies Social History   Social History  . Marital Status: Married    Spouse Name: N/A  . Number of Children: N/A  . Years of Education: N/A   Occupational History  . Not on file.   Social History Main Topics  . Smoking status: Former Smoker    Quit date: 10/27/2003  . Smokeless tobacco: Never Used  . Alcohol Use: Yes     Comment: 1-2 wine a month  . Drug Use: No  . Sexual Activity: Yes     Comment: married to [place], retired   Other Topics Concern  . Not on file   Social History Narrative     Review of Systems  All other systems reviewed and are  negative.      Objective:   Physical Exam  Cardiovascular: Normal rate, regular rhythm and normal heart sounds.   No murmur heard. Pulmonary/Chest: Effort normal and breath sounds normal. No respiratory distress. She has no wheezes. She has no rales.  Abdominal: Soft. Bowel sounds are normal. She exhibits no distension. There is no tenderness. There is no rebound and no guarding.  Musculoskeletal: She exhibits no edema.  Vitals reviewed.         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   Rule out infections with a GI pathogen panel. Check a CBC, CMP, TSH given her weight loss. While we are checking lab work I will also check a hemoglobin A1c and fasting lipid panel.  Recommended Restora daily along with fiber and gatorade replacement for dehydration.  Recheck in one week.

## 2016-01-05 ENCOUNTER — Other Ambulatory Visit: Payer: Medicare Other

## 2016-01-05 DIAGNOSIS — R197 Diarrhea, unspecified: Secondary | ICD-10-CM | POA: Diagnosis not present

## 2016-01-05 DIAGNOSIS — R634 Abnormal weight loss: Secondary | ICD-10-CM | POA: Diagnosis not present

## 2016-01-05 DIAGNOSIS — R7309 Other abnormal glucose: Secondary | ICD-10-CM | POA: Diagnosis not present

## 2016-01-05 DIAGNOSIS — R7303 Prediabetes: Secondary | ICD-10-CM | POA: Diagnosis not present

## 2016-01-05 DIAGNOSIS — E785 Hyperlipidemia, unspecified: Secondary | ICD-10-CM | POA: Diagnosis not present

## 2016-01-05 LAB — CBC WITH DIFFERENTIAL/PLATELET
BASOS ABS: 70 {cells}/uL (ref 0–200)
Basophils Relative: 1 %
EOS ABS: 140 {cells}/uL (ref 15–500)
EOS PCT: 2 %
HCT: 42.7 % (ref 35.0–45.0)
Hemoglobin: 14.5 g/dL (ref 12.0–15.0)
LYMPHS PCT: 27 %
Lymphs Abs: 1890 cells/uL (ref 850–3900)
MCH: 30.2 pg (ref 27.0–33.0)
MCHC: 34 g/dL (ref 32.0–36.0)
MCV: 89 fL (ref 80.0–100.0)
MONOS PCT: 10 %
MPV: 9.3 fL (ref 7.5–12.5)
Monocytes Absolute: 700 cells/uL (ref 200–950)
NEUTROS ABS: 4200 {cells}/uL (ref 1500–7800)
Neutrophils Relative %: 60 %
PLATELETS: 337 10*3/uL (ref 140–400)
RBC: 4.8 MIL/uL (ref 3.80–5.10)
RDW: 14.1 % (ref 11.0–15.0)
WBC: 7 10*3/uL (ref 3.8–10.8)

## 2016-01-05 LAB — HEMOGLOBIN A1C
HEMOGLOBIN A1C: 5.8 % — AB (ref <5.7)
MEAN PLASMA GLUCOSE: 120 mg/dL

## 2016-01-05 LAB — COMPLETE METABOLIC PANEL WITH GFR
ALBUMIN: 3.9 g/dL (ref 3.6–5.1)
ALK PHOS: 69 U/L (ref 33–130)
ALT: 46 U/L — ABNORMAL HIGH (ref 6–29)
AST: 35 U/L (ref 10–35)
BILIRUBIN TOTAL: 0.9 mg/dL (ref 0.2–1.2)
BUN: 9 mg/dL (ref 7–25)
CO2: 26 mmol/L (ref 20–31)
Calcium: 9.2 mg/dL (ref 8.6–10.4)
Chloride: 105 mmol/L (ref 98–110)
Creat: 0.76 mg/dL (ref 0.50–0.99)
GFR, Est African American: >89 mL/min (ref >=60)
GFR, Est Non African American: 82 mL/min (ref >=60)
GLUCOSE: 116 mg/dL — AB (ref 70–99)
Potassium: 4.1 mmol/L (ref 3.5–5.3)
SODIUM: 143 mmol/L (ref 135–146)
TOTAL PROTEIN: 6.2 g/dL (ref 6.1–8.1)

## 2016-01-05 LAB — LIPID PANEL
CHOL/HDL RATIO: 3.3 ratio (ref <=5.0)
Cholesterol: 127 mg/dL (ref 125–200)
HDL: 39 mg/dL — AB (ref >=46)
LDL CALC: 62 mg/dL (ref <130)
Triglycerides: 129 mg/dL (ref <150)
VLDL: 26 mg/dL (ref <30)

## 2016-01-05 LAB — TSH: TSH: 2.23 m[IU]/L

## 2016-01-08 ENCOUNTER — Encounter: Payer: Self-pay | Admitting: Family Medicine

## 2016-01-09 ENCOUNTER — Telehealth: Payer: Self-pay | Admitting: Family Medicine

## 2016-01-09 DIAGNOSIS — R197 Diarrhea, unspecified: Secondary | ICD-10-CM

## 2016-01-09 NOTE — Telephone Encounter (Signed)
We need to get a GI pathogen panel and also rule out C. difficile colitis to rule out infection. These are stool studies. I would like her to have these done as soon as possible. Meanwhile I would start Lomotil 2 tablets every 6 hours as needed for diarrhea. Continue the probiotic at the present time

## 2016-01-09 NOTE — Telephone Encounter (Signed)
[phone] Patient is having green diarrhea and is concerned about this  Please call her after 10 if possible

## 2016-01-10 MED ORDER — DIPHENOXYLATE-ATROPINE 2.5-0.025 MG PO TABS: 2.0000 | ORAL_TABLET | Freq: Four times a day (QID) | ORAL | Status: DC

## 2016-01-10 NOTE — Telephone Encounter (Signed)
Patient aware of providers recommendations. Med called to pharm and tests left up front for her to p/u

## 2016-01-11 ENCOUNTER — Other Ambulatory Visit: Payer: Medicare Other

## 2016-01-11 DIAGNOSIS — R197 Diarrhea, unspecified: Secondary | ICD-10-CM

## 2016-01-12 ENCOUNTER — Encounter: Payer: Self-pay | Admitting: Family Medicine

## 2016-01-12 LAB — GASTROINTESTINAL PATHOGEN PANEL PCR
C. DIFFICILE TOX A/B, PCR: NOT DETECTED
CAMPYLOBACTER, PCR: NOT DETECTED
Cryptosporidium, PCR: NOT DETECTED
E COLI (STEC) STX1/STX2, PCR: NOT DETECTED
E COLI 0157, PCR: NOT DETECTED
E coli (ETEC) LT/ST PCR: NOT DETECTED
Giardia lamblia, PCR: NOT DETECTED
Norovirus, PCR: NOT DETECTED
Rotavirus A, PCR: NOT DETECTED
SALMONELLA, PCR: NOT DETECTED
SHIGELLA, PCR: NOT DETECTED

## 2016-01-12 LAB — CLOSTRIDIUM DIFFICILE BY PCR: CDIFFPCR: NOT DETECTED

## 2016-01-15 ENCOUNTER — Encounter: Payer: Self-pay | Admitting: Family Medicine

## 2016-02-05 ENCOUNTER — Other Ambulatory Visit: Payer: Medicare Other

## 2016-02-05 ENCOUNTER — Other Ambulatory Visit: Payer: Self-pay | Admitting: Family Medicine

## 2016-02-05 DIAGNOSIS — R945 Abnormal results of liver function studies: Principal | ICD-10-CM

## 2016-02-05 DIAGNOSIS — R7989 Other specified abnormal findings of blood chemistry: Secondary | ICD-10-CM

## 2016-02-05 NOTE — Addendum Note (Signed)
Addended by: Haskel Khan A on: 02/05/2016 08:15 AM   Modules accepted: Orders

## 2016-02-06 ENCOUNTER — Encounter: Payer: Self-pay | Admitting: Family Medicine

## 2016-02-06 LAB — COMPLETE METABOLIC PANEL WITH GFR
ALBUMIN: 4 g/dL (ref 3.6–5.1)
ALT: 43 U/L — ABNORMAL HIGH (ref 6–29)
AST: 29 U/L (ref 10–35)
Alkaline Phosphatase: 85 U/L (ref 33–130)
BUN: 14 mg/dL (ref 7–25)
CHLORIDE: 104 mmol/L (ref 98–110)
CO2: 27 mmol/L (ref 20–31)
Calcium: 9.2 mg/dL (ref 8.6–10.4)
Creat: 0.79 mg/dL (ref 0.50–0.99)
GFR, EST NON AFRICAN AMERICAN: 78 mL/min (ref >=60)
GFR, Est African American: >89 mL/min (ref >=60)
GLUCOSE: 102 mg/dL — AB (ref 70–99)
POTASSIUM: 4.6 mmol/L (ref 3.5–5.3)
SODIUM: 139 mmol/L (ref 135–146)
Total Bilirubin: 1 mg/dL (ref 0.2–1.2)
Total Protein: 6 g/dL — ABNORMAL LOW (ref 6.1–8.1)

## 2016-02-07 ENCOUNTER — Other Ambulatory Visit: Payer: Self-pay | Admitting: Family Medicine

## 2016-02-07 DIAGNOSIS — R7989 Other specified abnormal findings of blood chemistry: Secondary | ICD-10-CM

## 2016-02-07 DIAGNOSIS — R945 Abnormal results of liver function studies: Principal | ICD-10-CM

## 2016-02-07 LAB — HEPATITIS PANEL, ACUTE
HCV Ab: NEGATIVE
HEP A IGM: NONREACTIVE
HEP B C IGM: NONREACTIVE
Hepatitis B Surface Ag: NEGATIVE

## 2016-02-09 ENCOUNTER — Encounter: Payer: Self-pay | Admitting: Family Medicine

## 2016-02-20 ENCOUNTER — Ambulatory Visit
Admission: RE | Admit: 2016-02-20 | Discharge: 2016-02-20 | Disposition: A | Discharge status: Home / Self Care | Payer: Medicare Other | Source: Ambulatory Visit | Attending: Family Medicine | Admitting: Family Medicine

## 2016-02-20 DIAGNOSIS — R945 Abnormal results of liver function studies: Principal | ICD-10-CM

## 2016-02-20 DIAGNOSIS — R7989 Other specified abnormal findings of blood chemistry: Secondary | ICD-10-CM

## 2016-02-21 ENCOUNTER — Encounter: Payer: Self-pay | Admitting: Family Medicine

## 2016-04-19 ENCOUNTER — Ambulatory Visit (INDEPENDENT_AMBULATORY_CARE_PROVIDER_SITE_OTHER): Payer: Medicare Other | Admitting: Family Medicine

## 2016-04-19 ENCOUNTER — Encounter: Payer: Self-pay | Admitting: Family Medicine

## 2016-04-19 VITALS — BP 142/88 | HR 72 | Temp 99.1°F | Resp 16 | Ht 64.5 in | Wt 184.0 lb

## 2016-04-19 DIAGNOSIS — J019 Acute sinusitis, unspecified: Secondary | ICD-10-CM | POA: Diagnosis not present

## 2016-04-19 MED ORDER — AMOXICILLIN 875 MG PO TABS: 875.0000 mg | ORAL_TABLET | Freq: Two times a day (BID) | ORAL | 0 refills | Status: DC

## 2016-04-19 NOTE — Progress Notes (Signed)
Subjective:    Patient ID: Donna Conner, female    DOB: [birth year], 65 y.o.   MRN: [id number]  HPI Symptoms began 2 days ago with head congestion, rhinorrhea, pressure in her maxillary and frontal sinus. She denies any fevers or chills. She does have a nonproductive cough. She denies any shortness of breath or chest pain. Examination today is significant for head congestion, clear rhinorrhea, and right frontal sinus pressure Past Medical History:  Diagnosis Date  . Anemia   . Arthritis    right hand  . 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 Colon polyps   . Hyperlipidemia   . Prediabetes    Past Surgical History:  Procedure Laterality Date  . CERVICAL CONIZATION W/BX  1978  . CHOLECYSTECTOMY    . COLONOSCOPY    . SKIN CANCER DESTRUCTION  07/29/06   basal cell  . TONSILLECTOMY AND ADENOIDECTOMY  1967   Current Outpatient Prescriptions on File Prior to Visit  Medication Sig Dispense Refill  . diphenoxylate-atropine (LOMOTIL) 2.5-0.025 MG tablet Take 2 tablets by mouth every 6 (six) hours. 90 tablet 0  . ondansetron (ZOFRAN) 4 MG tablet Take 1 tablet (4 mg total) by mouth every 8 (eight) hours as needed for nausea or vomiting. 20 tablet 0  . ondansetron (ZOFRAN) 4 MG tablet Take 1 tablet (4 mg total) by mouth every 4 (four) hours as needed for nausea or vomiting. 20 tablet 0   No current facility-administered medications on file prior to visit.    No Known Allergies Social History   Social History  . Marital status: Married    Spouse name: N/A  . Number of children: N/A  . Years of education: N/A   Occupational History  . Not on file.   Social History Main Topics  . Smoking status: Former Smoker    Quit date: 10/27/2003  . Smokeless tobacco: Never Used  . Alcohol use Yes     Comment: 1-2 wine a month  . Drug use: No  . Sexual activity: Yes     Comment: married to , retired   Other Topics Concern  . Not on file   Social History Narrative  .  No narrative on file      Review of Systems  All other systems reviewed and are negative.      Objective:   Physical Exam  Constitutional: She appears well-developed and well-nourished.  HENT:  Head: Normocephalic and atraumatic.  Right Ear: Tympanic membrane, external ear and ear canal normal.  Left Ear: Tympanic membrane, external ear and ear canal normal.  Nose: Mucosal edema and rhinorrhea present. Right sinus exhibits maxillary sinus tenderness and frontal sinus tenderness.  Mouth/Throat: Oropharynx is clear and moist. No oropharyngeal exudate.  Cardiovascular: Normal rate, regular rhythm and normal heart sounds.   Pulmonary/Chest: Effort normal and breath sounds normal. No respiratory distress. She has no wheezes. She has no rales.  Vitals reviewed.         Assessment & Plan:  Acute rhinosinusitis - Plan: amoxicillin (AMOXIL) 875 MG tablet  Symptoms are consistent with a viral upper respiratory infection sinusitis. I have recommended tincture of time. She can use Sudafed for head congestion, Mucinex for cough, and ibuprofen for headache and fever. Allow 7-10 days to resolve. Should symptoms worsen, the patient can begin to take amoxicillin 875 mg by mouth twice a day for 10 days should she develop signs or symptoms of bacterial sinusitis. We spent 5-10  minutes discussing this and went to fill the medication

## 2016-05-02 ENCOUNTER — Encounter: Payer: Self-pay | Admitting: Family Medicine

## 2016-05-02 DIAGNOSIS — D224 Melanocytic nevi of scalp and neck: Secondary | ICD-10-CM | POA: Diagnosis not present

## 2016-05-02 DIAGNOSIS — D2371 Other benign neoplasm of skin of right lower limb, including hip: Secondary | ICD-10-CM | POA: Diagnosis not present

## 2016-05-02 DIAGNOSIS — L821 Other seborrheic keratosis: Secondary | ICD-10-CM | POA: Diagnosis not present

## 2016-05-02 DIAGNOSIS — D485 Neoplasm of uncertain behavior of skin: Secondary | ICD-10-CM | POA: Diagnosis not present

## 2016-05-02 DIAGNOSIS — D1801 Hemangioma of skin and subcutaneous tissue: Secondary | ICD-10-CM | POA: Diagnosis not present

## 2016-05-02 DIAGNOSIS — L812 Freckles: Secondary | ICD-10-CM | POA: Diagnosis not present

## 2016-05-08 ENCOUNTER — Other Ambulatory Visit: Payer: Self-pay | Admitting: Family Medicine

## 2016-05-08 DIAGNOSIS — Z1231 Encounter for screening mammogram for malignant neoplasm of breast: Secondary | ICD-10-CM

## 2016-05-15 DIAGNOSIS — Z23 Encounter for immunization: Secondary | ICD-10-CM | POA: Diagnosis not present

## 2016-05-22 ENCOUNTER — Ambulatory Visit
Admission: RE | Admit: 2016-05-22 | Discharge: 2016-05-22 | Disposition: A | Discharge status: Home / Self Care | Payer: Medicare Other | Source: Ambulatory Visit | Attending: Family Medicine | Admitting: Family Medicine

## 2016-05-22 DIAGNOSIS — Z1231 Encounter for screening mammogram for malignant neoplasm of breast: Secondary | ICD-10-CM | POA: Diagnosis not present

## 2016-06-17 ENCOUNTER — Other Ambulatory Visit: Payer: Self-pay | Admitting: Family Medicine

## 2016-06-17 ENCOUNTER — Other Ambulatory Visit: Payer: Medicare Other

## 2016-06-17 DIAGNOSIS — R7303 Prediabetes: Secondary | ICD-10-CM

## 2016-06-17 DIAGNOSIS — Z0000 Encounter for general adult medical examination without abnormal findings: Secondary | ICD-10-CM

## 2016-06-17 DIAGNOSIS — Z79899 Other long term (current) drug therapy: Secondary | ICD-10-CM

## 2016-06-17 DIAGNOSIS — E785 Hyperlipidemia, unspecified: Secondary | ICD-10-CM

## 2016-06-17 LAB — COMPLETE METABOLIC PANEL WITH GFR
ALBUMIN: 4.3 g/dL (ref 3.6–5.1)
ALK PHOS: 85 U/L (ref 33–130)
ALT: 20 U/L (ref 6–29)
AST: 18 U/L (ref 10–35)
BILIRUBIN TOTAL: 1 mg/dL (ref 0.2–1.2)
BUN: 18 mg/dL (ref 7–25)
CALCIUM: 9.4 mg/dL (ref 8.6–10.4)
CHLORIDE: 104 mmol/L (ref 98–110)
CO2: 27 mmol/L (ref 20–31)
CREATININE: 0.71 mg/dL (ref 0.50–0.99)
GFR, Est Non African American: 89 mL/min (ref >=60)
Glucose, Bld: 119 mg/dL — ABNORMAL HIGH (ref 70–99)
Potassium: 4.7 mmol/L (ref 3.5–5.3)
Sodium: 139 mmol/L (ref 135–146)
TOTAL PROTEIN: 6.7 g/dL (ref 6.1–8.1)

## 2016-06-17 LAB — LIPID PANEL
CHOLESTEROL: 184 mg/dL (ref <200)
HDL: 55 mg/dL (ref >50)
LDL Cholesterol: 111 mg/dL — ABNORMAL HIGH (ref <100)
Total CHOL/HDL Ratio: 3.3 Ratio (ref <5.0)
Triglycerides: 88 mg/dL (ref <150)
VLDL: 18 mg/dL (ref <30)

## 2016-06-17 LAB — CBC WITH DIFFERENTIAL/PLATELET
BASOS PCT: 1 %
Basophils Absolute: 61 cells/uL (ref 0–200)
EOS ABS: 122 {cells}/uL (ref 15–500)
Eosinophils Relative: 2 %
HEMATOCRIT: 41.5 % (ref 35.0–45.0)
Hemoglobin: 14.1 g/dL (ref 12.0–15.0)
Lymphocytes Relative: 34 %
Lymphs Abs: 2074 cells/uL (ref 850–3900)
MCH: 30.1 pg (ref 27.0–33.0)
MCHC: 34 g/dL (ref 32.0–36.0)
MCV: 88.7 fL (ref 80.0–100.0)
MONO ABS: 366 {cells}/uL (ref 200–950)
MPV: 9.2 fL (ref 7.5–12.5)
Monocytes Relative: 6 %
NEUTROS ABS: 3477 {cells}/uL (ref 1500–7800)
Neutrophils Relative %: 57 %
PLATELETS: 324 10*3/uL (ref 140–400)
RBC: 4.68 MIL/uL (ref 3.80–5.10)
RDW: 13.5 % (ref 11.0–15.0)
WBC: 6.1 10*3/uL (ref 3.8–10.8)

## 2016-06-17 LAB — TSH: TSH: 2.01 m[IU]/L

## 2016-06-18 LAB — HEMOGLOBIN A1C
Hgb A1c MFr Bld: 5.5 % (ref <5.7)
MEAN PLASMA GLUCOSE: 111 mg/dL

## 2016-06-24 ENCOUNTER — Encounter: Payer: Self-pay | Admitting: Family Medicine

## 2016-06-24 ENCOUNTER — Ambulatory Visit (INDEPENDENT_AMBULATORY_CARE_PROVIDER_SITE_OTHER): Payer: Medicare Other | Admitting: Family Medicine

## 2016-06-24 VITALS — BP 132/74 | HR 68 | Temp 98.6°F | Resp 16 | Ht 64.5 in | Wt 184.0 lb

## 2016-06-24 DIAGNOSIS — Z0000 Encounter for general adult medical examination without abnormal findings: Secondary | ICD-10-CM | POA: Diagnosis not present

## 2016-06-24 DIAGNOSIS — Z23 Encounter for immunization: Secondary | ICD-10-CM | POA: Diagnosis not present

## 2016-06-24 DIAGNOSIS — Z87898 Personal history of other specified conditions: Secondary | ICD-10-CM | POA: Diagnosis not present

## 2016-06-24 NOTE — Addendum Note (Signed)
Addended by: Shary Decamp B on: 06/24/2016 10:03 AM   Modules accepted: Orders

## 2016-06-24 NOTE — Progress Notes (Signed)
Subjective:    Patient ID: Donna Conner, female    DOB: [birth year], 65 y.o.   MRN: [id number]  HPI Patient is a very pleasant 65 year old white female who is here today for complete physical exam. She had a colonoscopy in March/2016 which was significant for 2 sessile polyps. She is due for repeat colonoscopy in 4 years .Her mammogram was performed earlier this year and was normal. She is due for a Pap smear this year. Immunizations are up-to-date except for Prevnar 13.  Immunization History  Administered Date(s) Administered  . Hepatitis B 04/17/1995, 05/20/1995, 10/15/1995  . Influenza,inj,Quad PF,36+ Mos 05/20/2013, 05/31/2014, 06/19/2015  . Influenza-Unspecified 05/15/2016  . Pneumococcal Polysaccharide-23 06/19/2015  . Tdap 05/18/2011, 05/31/2014  . Zoster 06/18/2011    Her most recent lab work as listed below: Appointment on 06/17/2016  Component Date Value Ref Range Status  . Sodium 06/17/2016 139  135 - 146 mmol/L Final  . Potassium 06/17/2016 4.7  3.5 - 5.3 mmol/L Final  . Chloride 06/17/2016 104  98 - 110 mmol/L Final  . CO2 06/17/2016 27  20 - 31 mmol/L Final  . Glucose, Bld 06/17/2016 119* 70 - 99 mg/dL Final  . BUN 06/17/2016 18  7 - 25 mg/dL Final  . Creat 06/17/2016 0.71  0.50 - 0.99 mg/dL Final   Comment:   For patients > or = 67 years of age: The upper reference limit for Creatinine is approximately 13% higher for people identified as African-American.     . Total Bilirubin 06/17/2016 1.0  0.2 - 1.2 mg/dL Final  . Alkaline Phosphatase 06/17/2016 85  33 - 130 U/L Final  . AST 06/17/2016 18  10 - 35 U/L Final  . ALT 06/17/2016 20  6 - 29 U/L Final  . Total Protein 06/17/2016 6.7  6.1 - 8.1 g/dL Final  . Albumin 06/17/2016 4.3  3.6 - 5.1 g/dL Final  . Calcium 06/17/2016 9.4  8.6 - 10.4 mg/dL Final  . GFR, Est African American 06/17/2016 >89  >=60 mL/min Final  . GFR, Est Non African American 06/17/2016 89  >=60 mL/min Final  . TSH 06/17/2016 2.01  mIU/L Final     Comment:   Reference Range   > or = 20 Years  0.40-4.50   Pregnancy Range First trimester  0.26-2.66 Second trimester 0.55-2.73 Third trimester  0.43-2.91     . Cholesterol 06/17/2016 184  <200 mg/dL Final   Comment: ** Please note change in reference range(s). **     . Triglycerides 06/17/2016 88  <150 mg/dL Final   Comment: ** Please note change in reference range(s). **     . HDL 06/17/2016 55  >50 mg/dL Final   Comment: ** Please note change in reference range(s). **     . Total CHOL/HDL Ratio 06/17/2016 3.3  <5.0 Ratio Final  . VLDL 06/17/2016 18  <30 mg/dL Final  . LDL Cholesterol 06/17/2016 111* <100 mg/dL Final   Comment: ** Please note change in reference range(s). **     . WBC 06/17/2016 6.1  3.8 - 10.8 K/uL Final  . RBC 06/17/2016 4.68  3.80 - 5.10 MIL/uL Final  . Hemoglobin 06/17/2016 14.1  12.0 - 15.0 g/dL Final  . HCT 06/17/2016 41.5  35.0 - 45.0 % Final  . MCV 06/17/2016 88.7  80.0 - 100.0 fL Final  . MCH 06/17/2016 30.1  27.0 - 33.0 pg Final  . MCHC 06/17/2016 34.0  32.0 - 36.0 g/dL Final  . RDW 06/17/2016 13.5  11.0 - 15.0 % Final  . Platelets 06/17/2016 324  140 - 400 K/uL Final  . MPV 06/17/2016 9.2  7.5 - 12.5 fL Final  . Neutro Abs 06/17/2016 3477  1,500 - 7,800 cells/uL Final  . Lymphs Abs 06/17/2016 2074  850 - 3,900 cells/uL Final  . Monocytes Absolute 06/17/2016 366  200 - 950 cells/uL Final  . Eosinophils Absolute 06/17/2016 122  15 - 500 cells/uL Final  . Basophils Absolute 06/17/2016 61  0 - 200 cells/uL Final  . Neutrophils Relative % 06/17/2016 57  % Final  . Lymphocytes Relative 06/17/2016 34  % Final  . Monocytes Relative 06/17/2016 6  % Final  . Eosinophils Relative 06/17/2016 2  % Final  . Basophils Relative 06/17/2016 1  % Final  . Smear Review 06/17/2016 Criteria for review not met   Final  . Hgb A1c MFr Bld 06/18/2016 5.5  <5.7 % Final   Comment:   For the purpose of screening for the presence of diabetes:   <5.7%        Consistent with the absence of diabetes 5.7-6.4 %   Consistent with increased risk for diabetes (prediabetes) >=6.5 %     Consistent with diabetes   This assay result is consistent with a decreased risk of diabetes.   Currently, no consensus exists regarding use of hemoglobin A1c for diagnosis of diabetes in children.   According to American Diabetes Association (ADA) guidelines, hemoglobin A1c <7.0% represents optimal control in non-pregnant diabetic patients. Different metrics may apply to specific patient populations. Standards of Medical Care in Diabetes (ADA).     . Mean Plasma Glucose 06/18/2016 111  mg/dL Final   Past Medical History:  Diagnosis Date  . Anemia   . Arthritis    right hand  . Cancer ([place])    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  . Colon polyps   . Hyperlipidemia   . Prediabetes    Past Surgical History:  Procedure Laterality Date  . CERVICAL CONIZATION W/BX  1978  . CHOLECYSTECTOMY    . COLONOSCOPY    . SKIN CANCER DESTRUCTION  07/29/06   basal cell  . TONSILLECTOMY AND ADENOIDECTOMY  1967   Current Outpatient Prescriptions on File Prior to Visit  Medication Sig Dispense Refill  . diphenoxylate-atropine (LOMOTIL) 2.5-0.025 MG tablet Take 2 tablets by mouth every 6 (six) hours. 90 tablet 0  . ondansetron (ZOFRAN) 4 MG tablet Take 1 tablet (4 mg total) by mouth every 8 (eight) hours as needed for nausea or vomiting. 20 tablet 0   No current facility-administered medications on file prior to visit.    No Known Allergies Social History   Social History  . Marital status: Married    Spouse name: N/A  . Number of children: N/A  . Years of education: N/A   Occupational History  . Not on file.   Social History Main Topics  . Smoking status: Former Smoker    Quit date: 10/27/2003  . Smokeless tobacco: Never Used  . Alcohol use Yes     Comment: 1-2 wine a month  . Drug use: No  . Sexual activity: Yes     Comment: married to [place], retired    Other Topics Concern  . Not on file   Social History Narrative  . No narrative on file   Family History  Problem Relation Age of Onset  . Colon cancer Neg Hx   . Esophageal cancer Neg Hx   . Rectal  cancer Neg Hx   . Stomach cancer Neg Hx    Unfortunately, her sister just died due to complications of diabetes.   Review of Systems  All other systems reviewed and are negative.      Objective:   Physical Exam  Constitutional: She is oriented to person, place, and time. She appears well-developed and well-nourished. No distress.  HENT:  Head: Normocephalic and atraumatic.  Right Ear: External ear normal.  Left Ear: External ear normal.  Nose: Nose normal.  Mouth/Throat: Oropharynx is clear and moist. No oropharyngeal exudate.  Eyes: Conjunctivae and EOM are normal. Pupils are equal, round, and reactive to light. Right eye exhibits no discharge. Left eye exhibits no discharge. No scleral icterus.  Neck: Normal range of motion. Neck supple. No JVD present. No tracheal deviation present. No thyromegaly present.  Cardiovascular: Normal rate, regular rhythm, normal heart sounds and intact distal pulses.  Exam reveals no gallop and no friction rub.   No murmur heard. Pulmonary/Chest: Effort normal and breath sounds normal. No stridor. No respiratory distress. She has no wheezes. She has no rales. She exhibits no tenderness.  Abdominal: Soft. Bowel sounds are normal. She exhibits no distension and no mass. There is no tenderness. There is no rebound and no guarding.  Genitourinary: Vagina normal and uterus normal.  Musculoskeletal: Normal range of motion. She exhibits no edema or tenderness.  Lymphadenopathy:    She has no cervical adenopathy.  Neurological: She is alert and oriented to person, place, and time. She has normal reflexes. No cranial nerve deficit. She exhibits normal muscle tone. Coordination normal.  Skin: Skin is warm. No rash noted. She is not diaphoretic. No  erythema. No pallor.  Psychiatric: She has a normal mood and affect. Her behavior is normal. Judgment and thought content normal.  Vitals reviewed.         Assessment & Plan:  Routine general medical examination at a health care facility  We had a long discussion today regarding a low carbohydrate diet to address her prediabetes. LDL cholesterol did go up after she stopped pravastatin but so did her HDL cholesterol. Cholesterol still outstanding. Liver tests are better since discontinuing statin. My only concern is her prediabetes. Continue to focus on a low-carb diet and exercise. Colonoscopy and grandma Phillip Heal up-to-date. Pap smear was performed today. The remainder of her lab work is excellent. Patient received Prevnar 13

## 2016-06-26 LAB — PAP, THIN PREP W/HPV RFLX HPV TYPE 16/18: HPV DNA High Risk: NOT DETECTED

## 2016-07-16 ENCOUNTER — Encounter: Payer: Self-pay | Admitting: Family Medicine

## 2016-07-16 ENCOUNTER — Ambulatory Visit (INDEPENDENT_AMBULATORY_CARE_PROVIDER_SITE_OTHER): Payer: Medicare Other | Admitting: Family Medicine

## 2016-07-16 VITALS — BP 128/76 | HR 72 | Temp 98.4°F | Resp 16 | Ht 64.5 in | Wt 186.0 lb

## 2016-07-16 DIAGNOSIS — H0012 Chalazion right lower eyelid: Secondary | ICD-10-CM

## 2016-07-16 MED ORDER — ERYTHROMYCIN 5 MG/GM OP OINT: 1.0000 "application " | TOPICAL_OINTMENT | Freq: Every day | OPHTHALMIC | 0 refills | Status: DC

## 2016-07-16 NOTE — Progress Notes (Signed)
   Subjective:    Patient ID: Donna Conner, female    DOB: [birth year], 65 y.o.   MRN: [id number]  HPI Patient has had right eye pain for about a week. She is now forming a chalazion on her right lower eyelid. It is approximately 3 mm in diameter. It is purple and erythematous and tender to the touch. I opened up the eyelid and there is no visible pustules on the inside on the conjunctival surface. There is no blepharitis. She denies any blurry vision or double vision. She denies any discharge coming from the eye. There is no conjunctivitis Past Medical History:  Diagnosis Date  . Anemia   . Arthritis    right hand  . 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 Colon polyps   . Hyperlipidemia   . Prediabetes    Past Surgical History:  Procedure Laterality Date  . CERVICAL CONIZATION W/BX  1978  . CHOLECYSTECTOMY    . COLONOSCOPY    . SKIN CANCER DESTRUCTION  07/29/06   basal cell  . TONSILLECTOMY AND ADENOIDECTOMY  1967   No current outpatient prescriptions on file prior to visit.   No current facility-administered medications on file prior to visit.    No Known Allergies Social History   Social History  . Marital status: Married    Spouse name: N/A  . Number of children: N/A  . Years of education: N/A   Occupational History  . Not on file.   Social History Main Topics  . Smoking status: Former Smoker    Quit date: 10/27/2003  . Smokeless tobacco: Never Used  . Alcohol use Yes     Comment: 1-2 wine a month  . Drug use: No  . Sexual activity: Yes     Comment: married to [place], retired   Other Topics Concern  . Not on file   Social History Narrative  . No narrative on file      Review of Systems  All other systems reviewed and are negative.      Objective:   Physical Exam  Cardiovascular: Normal rate and regular rhythm.   Pulmonary/Chest: Effort normal and breath sounds normal.  Vitals reviewed.  See hpi       Assessment & Plan:    Chalazion of right lower eyelid - Plan: erythromycin [facility]) ophthalmic ointment  Recommended erythromycin ophthalmic ointment every night for the next week. Recommended warm compresses 3 times a day 15 minutes a time. Recommended lid scrubs using baby shampoo to help remove any debris that could be clogging the mebomian gland. Consult ophthalmology if no better in 2 weeks

## 2016-07-30 ENCOUNTER — Telehealth: Payer: Self-pay | Admitting: Family Medicine

## 2016-07-30 NOTE — Telephone Encounter (Signed)
Patient called in states she has received an EOB from [facility] 06/24/2016 Medicare didn't pay on her pap. It looks as if General Routine Exam code was used please provide a different code since Medicare doesn't pay for that code. I will call [facility]  CB# [phone]

## 2016-08-19 ENCOUNTER — Encounter: Payer: Self-pay | Admitting: Family Medicine

## 2016-08-19 DIAGNOSIS — Z8742 Personal history of other diseases of the female genital tract: Secondary | ICD-10-CM | POA: Insufficient documentation

## 2016-08-19 NOTE — Telephone Encounter (Signed)
On 08/07/2016 I spoke with Asencion Partridge in reference to this account. I had added Z87.898 Abnormal Pap. I have advised patient that they are resubmitting this claim.

## 2016-09-10 ENCOUNTER — Encounter: Payer: Self-pay | Admitting: Family Medicine

## 2016-09-10 ENCOUNTER — Ambulatory Visit (INDEPENDENT_AMBULATORY_CARE_PROVIDER_SITE_OTHER): Payer: Medicare Other | Admitting: Family Medicine

## 2016-09-10 ENCOUNTER — Ambulatory Visit
Admission: RE | Admit: 2016-09-10 | Discharge: 2016-09-10 | Disposition: A | Discharge status: Home / Self Care | Payer: Medicare Other | Source: Ambulatory Visit | Attending: Family Medicine | Admitting: Family Medicine

## 2016-09-10 VITALS — BP 126/72 | HR 74 | Temp 98.0°F | Resp 14 | Ht 64.5 in | Wt 185.0 lb

## 2016-09-10 DIAGNOSIS — M545 Low back pain, unspecified: Secondary | ICD-10-CM

## 2016-09-10 DIAGNOSIS — M47816 Spondylosis without myelopathy or radiculopathy, lumbar region: Secondary | ICD-10-CM | POA: Diagnosis not present

## 2016-09-10 NOTE — Progress Notes (Signed)
   Subjective:    Patient ID: Donna Conner, female    DOB: 1951/01/08, 66 y.o.   MRN: [id number]  HPI Patient has been dealing with right-sided low back pain now for several weeks. The pain is not located over the spinous processes it is rather in the right lower flank just above the gluteus. However it is intense. At times it brings tears to her eyes. There are no alleviating factors. It will come and go without provocation, but flexion/motion certainly seems to make it worse. She denies any hematuria or dysuria. There is no tenderness to palpation in that area today. She has diminished range of motion in her back. She denies any radiation of the pain into her hip or down her right leg. She denies any neuropathic pain in her leg. Past Medical History:  Diagnosis Date  . Anemia   . Arthritis    right hand  . 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 Colon polyps   . Hyperlipidemia   . Prediabetes    Past Surgical History:  Procedure Laterality Date  . CERVICAL CONIZATION W/BX  1978  . CHOLECYSTECTOMY    . COLONOSCOPY    . SKIN CANCER DESTRUCTION  07/29/06   basal cell  . TONSILLECTOMY AND ADENOIDECTOMY  1967   No current outpatient prescriptions on file prior to visit.   No current facility-administered medications on file prior to visit.    No Known Allergies Social History   Social History  . Marital status: Married    Spouse name: N/A  . Number of children: N/A  . Years of education: N/A   Occupational History  . Not on file.   Social History Main Topics  . Smoking status: Former Smoker    Quit date: 10/27/2003  . Smokeless tobacco: Never Used  . Alcohol use Yes     Comment: 1-2 wine a month  . Drug use: No  . Sexual activity: Yes     Comment: married to [place], retired   Other Topics Concern  . Not on file   Social History Narrative  . No narrative on file     Review of Systems  All other systems reviewed and are negative.      Objective:     Physical Exam  Cardiovascular: Normal rate, regular rhythm and normal heart sounds.   Pulmonary/Chest: Effort normal and breath sounds normal.  Abdominal: Soft. Bowel sounds are normal. She exhibits no distension. There is no tenderness. There is no rebound.  Musculoskeletal:       Lumbar back: She exhibits decreased range of motion and pain. She exhibits no tenderness, no bony tenderness, no swelling, no edema, no deformity and no spasm.  Vitals reviewed.         Assessment & Plan:  Low back pain at multiple sites - Plan: DG Lumbar Spine Complete  I'm concerned about degenerative disc disease and possibly herniated disc given the intensity the pain even know the pain seems to be located more in the right lower flank, I believe this could be referred pain from herniated disc. Proceed with an x-ray of the lumbar spine. Further treatment options based on the results

## 2016-09-11 ENCOUNTER — Encounter: Payer: Self-pay | Admitting: Family Medicine

## 2016-09-11 MED ORDER — PREDNISONE 20 MG PO TABS: ORAL_TABLET | ORAL | 0 refills | Status: DC

## 2017-01-07 DIAGNOSIS — T63461A Toxic effect of venom of wasps, accidental (unintentional), initial encounter: Secondary | ICD-10-CM | POA: Diagnosis not present

## 2017-01-07 DIAGNOSIS — T7840XA Allergy, unspecified, initial encounter: Secondary | ICD-10-CM | POA: Diagnosis not present

## 2017-01-28 ENCOUNTER — Encounter: Payer: Self-pay | Admitting: Family Medicine

## 2017-01-28 ENCOUNTER — Other Ambulatory Visit: Payer: Self-pay | Admitting: Family Medicine

## 2017-01-28 MED ORDER — ERYTHROMYCIN 5 MG/GM OP OINT: 1.0000 "application " | TOPICAL_OINTMENT | Freq: Every day | OPHTHALMIC | 0 refills | Status: DC

## 2017-02-20 IMAGING — MG DIGITAL SCREENING BILATERAL MAMMOGRAM WITH CAD
5 series · 5 of 5 positions shown · non-contrast
Comparison: Previous exam(s).

CLINICAL DATA: Screening.

EXAM:
DIGITAL SCREENING BILATERAL MAMMOGRAM WITH CAD

[L MLO (1 of 2)]
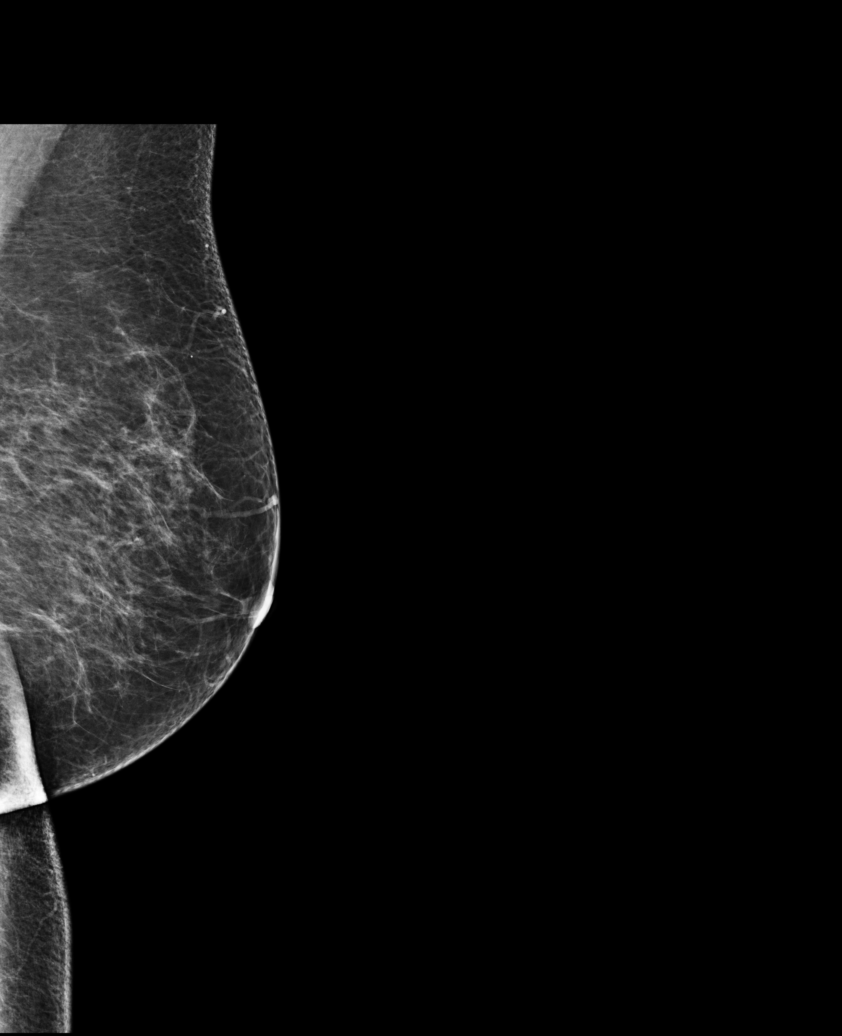

[L CC]
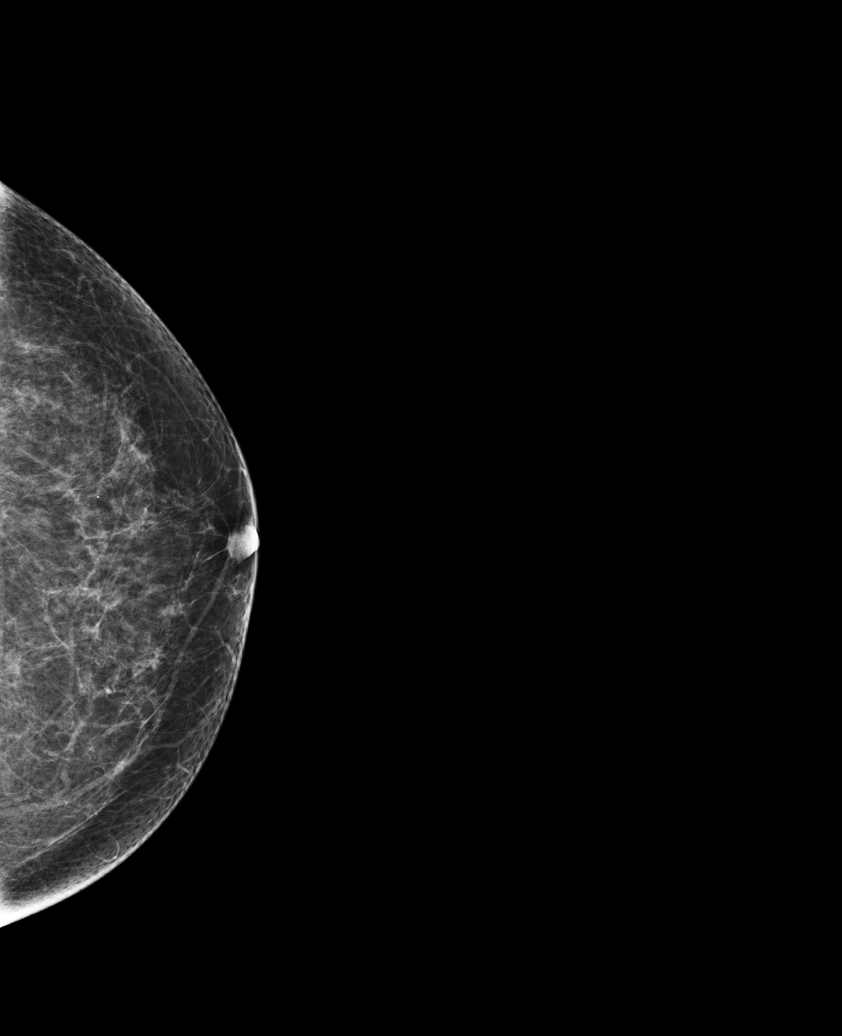

[L MLO (2 of 2)]
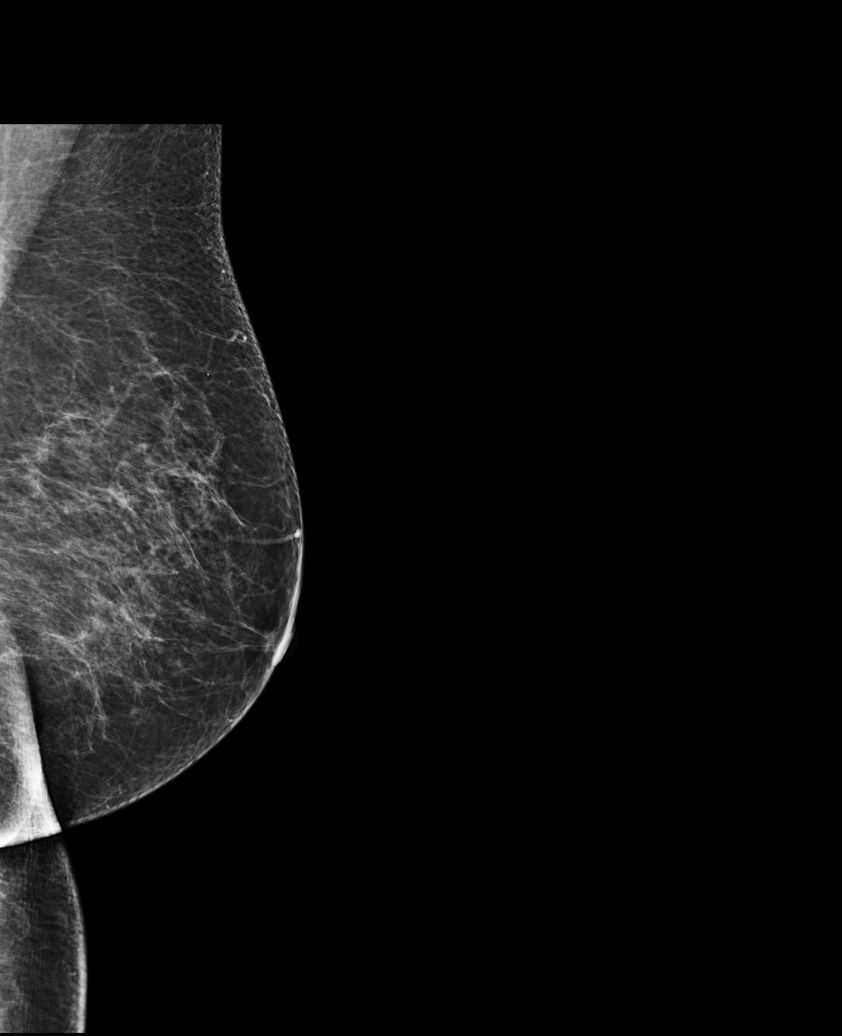

[R MLO]
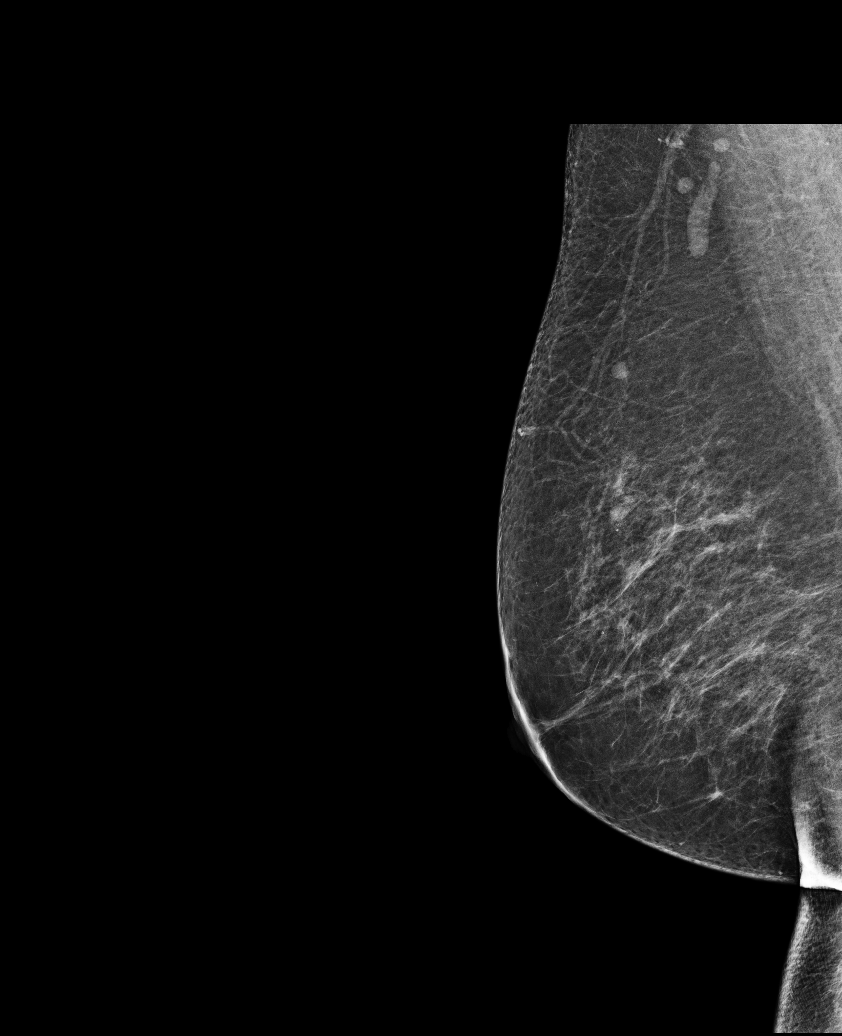

[R CC]
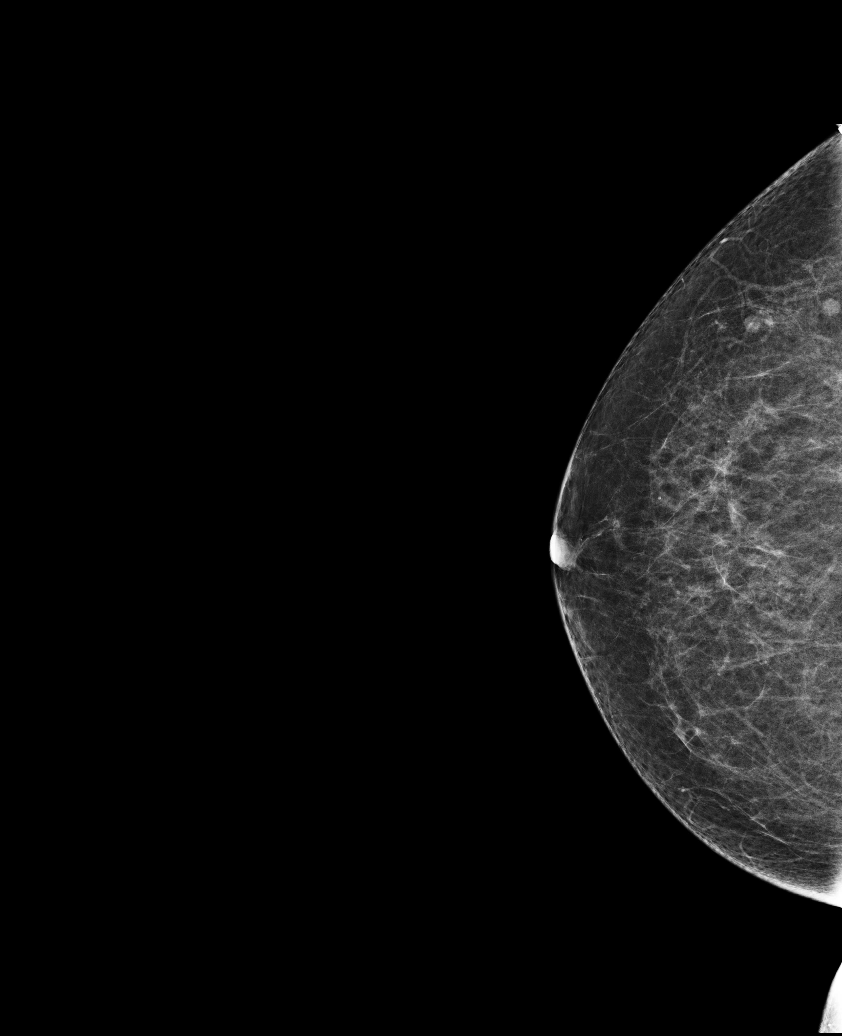

[5 of 5 positions shown; findings below may reference images not displayed]

ACR Breast Density Category b: There are scattered areas of
fibroglandular density.
FINDINGS: There are no findings suspicious for malignancy. Images were
processed with CAD.
IMPRESSION: No mammographic evidence of malignancy. A result letter of this
screening mammogram will be mailed directly to the patient.

RECOMMENDATION:
Screening mammogram in one year. (Code:AS-G-LCT)

BI-RADS CATEGORY  1: Negative.

## 2017-04-16 ENCOUNTER — Other Ambulatory Visit: Payer: Self-pay | Admitting: Family Medicine

## 2017-04-16 DIAGNOSIS — Z1231 Encounter for screening mammogram for malignant neoplasm of breast: Secondary | ICD-10-CM

## 2017-05-23 ENCOUNTER — Ambulatory Visit
Admission: RE | Admit: 2017-05-23 | Discharge: 2017-05-23 | Disposition: A | Discharge status: Home / Self Care | Payer: Medicare Other | Source: Ambulatory Visit | Attending: Family Medicine | Admitting: Family Medicine

## 2017-05-23 DIAGNOSIS — Z1231 Encounter for screening mammogram for malignant neoplasm of breast: Secondary | ICD-10-CM | POA: Diagnosis not present

## 2017-05-26 DIAGNOSIS — H2513 Age-related nuclear cataract, bilateral: Secondary | ICD-10-CM | POA: Diagnosis not present

## 2017-06-12 DIAGNOSIS — Z23 Encounter for immunization: Secondary | ICD-10-CM | POA: Diagnosis not present

## 2017-06-23 ENCOUNTER — Other Ambulatory Visit: Payer: Medicare Other

## 2017-06-26 ENCOUNTER — Encounter: Payer: Medicare Other | Admitting: Family Medicine

## 2017-07-03 ENCOUNTER — Other Ambulatory Visit: Payer: Self-pay

## 2017-07-04 ENCOUNTER — Other Ambulatory Visit: Payer: Self-pay | Admitting: Family Medicine

## 2017-07-04 DIAGNOSIS — R7303 Prediabetes: Secondary | ICD-10-CM

## 2017-07-04 DIAGNOSIS — Z0000 Encounter for general adult medical examination without abnormal findings: Secondary | ICD-10-CM

## 2017-07-04 DIAGNOSIS — E785 Hyperlipidemia, unspecified: Secondary | ICD-10-CM

## 2017-07-07 ENCOUNTER — Other Ambulatory Visit: Payer: Medicare Other

## 2017-07-09 ENCOUNTER — Other Ambulatory Visit: Payer: Medicare Other

## 2017-07-09 DIAGNOSIS — Z0000 Encounter for general adult medical examination without abnormal findings: Secondary | ICD-10-CM

## 2017-07-09 DIAGNOSIS — E785 Hyperlipidemia, unspecified: Secondary | ICD-10-CM | POA: Diagnosis not present

## 2017-07-09 DIAGNOSIS — R7303 Prediabetes: Secondary | ICD-10-CM

## 2017-07-10 LAB — COMPLETE METABOLIC PANEL WITH GFR
AG Ratio: 1.9 (calc) (ref 1.0–2.5)
ALKALINE PHOSPHATASE (APISO): 79 U/L (ref 33–130)
ALT: 14 U/L (ref 6–29)
AST: 15 U/L (ref 10–35)
Albumin: 4.4 g/dL (ref 3.6–5.1)
BUN: 13 mg/dL (ref 7–25)
CO2: 28 mmol/L (ref 20–32)
CREATININE: 0.75 mg/dL (ref 0.50–0.99)
Calcium: 10.1 mg/dL (ref 8.6–10.4)
Chloride: 100 mmol/L (ref 98–110)
GFR, Est African American: 95 mL/min/{1.73_m2} (ref >=60)
GFR, Est Non African American: 82 mL/min/{1.73_m2} (ref >=60)
GLOBULIN: 2.3 g/dL (ref 1.9–3.7)
GLUCOSE: 131 mg/dL — AB (ref 65–99)
Potassium: 4.6 mmol/L (ref 3.5–5.3)
SODIUM: 138 mmol/L (ref 135–146)
Total Bilirubin: 0.9 mg/dL (ref 0.2–1.2)
Total Protein: 6.7 g/dL (ref 6.1–8.1)

## 2017-07-10 LAB — CBC WITH DIFFERENTIAL/PLATELET
BASOS ABS: 60 {cells}/uL (ref 0–200)
Basophils Relative: 1 %
EOS ABS: 102 {cells}/uL (ref 15–500)
Eosinophils Relative: 1.7 %
HEMATOCRIT: 42 % (ref 35.0–45.0)
HEMOGLOBIN: 14.8 g/dL (ref 11.7–15.5)
LYMPHS ABS: 1578 {cells}/uL (ref 850–3900)
MCH: 30.3 pg (ref 27.0–33.0)
MCHC: 35.2 g/dL (ref 32.0–36.0)
MCV: 86.1 fL (ref 80.0–100.0)
MPV: 9.7 fL (ref 7.5–12.5)
Monocytes Relative: 7.3 %
NEUTROS ABS: 3822 {cells}/uL (ref 1500–7800)
NEUTROS PCT: 63.7 %
Platelets: 339 10*3/uL (ref 140–400)
RBC: 4.88 10*6/uL (ref 3.80–5.10)
RDW: 12.6 % (ref 11.0–15.0)
Total Lymphocyte: 26.3 %
WBC: 6 10*3/uL (ref 3.8–10.8)
WBCMIX: 438 {cells}/uL (ref 200–950)

## 2017-07-10 LAB — LIPID PANEL
CHOL/HDL RATIO: 3.5 (calc) (ref <5.0)
CHOLESTEROL: 176 mg/dL (ref <200)
HDL: 50 mg/dL — AB (ref >50)
LDL Cholesterol (Calc): 105 mg/dL (calc) — ABNORMAL HIGH
Non-HDL Cholesterol (Calc): 126 mg/dL (calc) (ref <130)
Triglycerides: 110 mg/dL (ref <150)

## 2017-07-10 LAB — HEMOGLOBIN A1C
EAG (MMOL/L): 6.2 (calc)
HEMOGLOBIN A1C: 5.5 %{Hb} (ref <5.7)
MEAN PLASMA GLUCOSE: 111 (calc)

## 2017-07-11 ENCOUNTER — Ambulatory Visit (INDEPENDENT_AMBULATORY_CARE_PROVIDER_SITE_OTHER): Payer: Medicare Other | Admitting: Family Medicine

## 2017-07-11 ENCOUNTER — Encounter: Payer: Self-pay | Admitting: Family Medicine

## 2017-07-11 VITALS — BP 118/72 | HR 74 | Temp 97.9°F | Resp 14 | Ht 64.5 in | Wt 172.0 lb

## 2017-07-11 DIAGNOSIS — M7062 Trochanteric bursitis, left hip: Secondary | ICD-10-CM

## 2017-07-11 DIAGNOSIS — R7303 Prediabetes: Secondary | ICD-10-CM

## 2017-07-11 DIAGNOSIS — Z780 Asymptomatic menopausal state: Secondary | ICD-10-CM | POA: Diagnosis not present

## 2017-07-11 DIAGNOSIS — Z0000 Encounter for general adult medical examination without abnormal findings: Secondary | ICD-10-CM | POA: Diagnosis not present

## 2017-07-11 NOTE — Progress Notes (Signed)
Subjective:    Patient ID: Donna Conner, female    DOB: August 06, 1950, 66 y.o.   MRN: [id number]  HPI Patient is a very pleasant 66 year old white female here today for a physical exam.  Her mammogram was performed in October and was normal.  She has never had a bone density.  Her Pap smear was performed last year and was normal.  Her colonoscopy was performed in 2016 and is due again in 2021 due to history of 2 tubular adenomas.  She does complain of pain in her left hip over the greater trochanter.  It hurts for her to sleep on that side at night.  She is interested in receiving a cortisone injection for greater trochanteric bursitis  Immunization History  Administered Date(s) Administered  . Hepatitis B 04/17/1995, 05/20/1995, 10/15/1995  . Influenza, High Dose Seasonal PF 06/12/2017  . Influenza,inj,Quad PF,6+ Mos 05/20/2013, 05/31/2014, 06/19/2015  . Influenza-Unspecified 05/15/2016  . Pneumococcal Conjugate-13 06/24/2016  . Pneumococcal Polysaccharide-23 06/19/2015  . Tdap 05/18/2011, 05/31/2014  . Zoster 06/18/2011    Her most recent lab work as listed below: Appointment on 07/09/2017  Component Date Value Ref Range Status  . Glucose, Bld 07/09/2017 131* 65 - 99 mg/dL Final   Comment: .            Fasting reference interval . For someone without known diabetes, a glucose value >125 mg/dL indicates that they may have diabetes and this should be confirmed with a follow-up test. .   . BUN 07/09/2017 13  7 - 25 mg/dL Final  . Creat 07/09/2017 0.75  0.50 - 0.99 mg/dL Final   Comment: For patients >90 years of age, the reference limit for Creatinine is approximately 13% higher for people identified as African-American. .   . GFR, Est Non African American 07/09/2017 82  > OR = 60 mL/min/1.58m2 Final  . GFR, Est African American 07/09/2017 95  > OR = 60 mL/min/1.29m2 Final  . BUN/Creatinine Ratio 32/67/1245 NOT APPLICABLE  6 - 22 (calc) Final  . Sodium 07/09/2017 138  135 - 146  mmol/L Final  . Potassium 07/09/2017 4.6  3.5 - 5.3 mmol/L Final  . Chloride 07/09/2017 100  98 - 110 mmol/L Final  . CO2 07/09/2017 28  20 - 32 mmol/L Final  . Calcium 07/09/2017 10.1  8.6 - 10.4 mg/dL Final  . Total Protein 07/09/2017 6.7  6.1 - 8.1 g/dL Final  . Albumin 07/09/2017 4.4  3.6 - 5.1 g/dL Final  . Globulin 07/09/2017 2.3  1.9 - 3.7 g/dL (calc) Final  . AG Ratio 07/09/2017 1.9  1.0 - 2.5 (calc) Final  . Total Bilirubin 07/09/2017 0.9  0.2 - 1.2 mg/dL Final  . Alkaline phosphatase (APISO) 07/09/2017 79  33 - 130 U/L Final  . AST 07/09/2017 15  10 - 35 U/L Final  . ALT 07/09/2017 14  6 - 29 U/L Final  . Cholesterol 07/09/2017 176  <200 mg/dL Final  . HDL 07/09/2017 50* >50 mg/dL Final  . Triglycerides 07/09/2017 110  <150 mg/dL Final  . LDL Cholesterol (Calc) 07/09/2017 105* mg/dL (calc) Final   Comment: Reference range: <100 . Desirable range <100 mg/dL for primary prevention;   <70 mg/dL for patients with CHD or diabetic patients  with > or = 2 CHD risk factors. Marland Kitchen LDL-C is now calculated using the Martin-Hopkins  calculation, which is a validated novel method providing  better accuracy than the Friedewald equation in the  estimation of LDL-C.  Cresenciano Genre  et al. JAMA. 3151;761(60): 2061-2068  (http://education.[facility].com/faq/FAQ164)   . Total CHOL/HDL Ratio 07/09/2017 3.5  <5.0 (calc) Final  . Non-HDL Cholesterol (Calc) 07/09/2017 126  <130 mg/dL (calc) Final   Comment: For patients with diabetes plus 1 major ASCVD risk  factor, treating to a non-HDL-C goal of <100 mg/dL  (LDL-C of <70 mg/dL) is considered a therapeutic  option.   . WBC 07/09/2017 6.0  3.8 - 10.8 Thousand/uL Final  . RBC 07/09/2017 4.88  3.80 - 5.10 Million/uL Final  . Hemoglobin 07/09/2017 14.8  11.7 - 15.5 g/dL Final  . HCT 07/09/2017 42.0  35.0 - 45.0 % Final  . MCV 07/09/2017 86.1  80.0 - 100.0 fL Final  . MCH 07/09/2017 30.3  27.0 - 33.0 pg Final  . MCHC 07/09/2017 35.2  32.0 -  36.0 g/dL Final  . RDW 07/09/2017 12.6  11.0 - 15.0 % Final  . Platelets 07/09/2017 339  140 - 400 Thousand/uL Final  . MPV 07/09/2017 9.7  7.5 - 12.5 fL Final  . Neutro Abs 07/09/2017 3,822  1,500 - 7,800 cells/uL Final  . Lymphs Abs 07/09/2017 1,578  850 - 3,900 cells/uL Final  . WBC mixed population 07/09/2017 438  200 - 950 cells/uL Final  . Eosinophils Absolute 07/09/2017 102  15 - 500 cells/uL Final  . Basophils Absolute 07/09/2017 60  0 - 200 cells/uL Final  . Neutrophils Relative % 07/09/2017 63.7  % Final  . Total Lymphocyte 07/09/2017 26.3  % Final  . Monocytes Relative 07/09/2017 7.3  % Final  . Eosinophils Relative 07/09/2017 1.7  % Final  . Basophils Relative 07/09/2017 1.0  % Final  . Hgb A1c MFr Bld 07/09/2017 5.5  <5.7 % of total Hgb Final   Comment: For the purpose of screening for the presence of diabetes: . <5.7%       Consistent with the absence of diabetes 5.7-6.4%    Consistent with increased risk for diabetes             (prediabetes) > or =6.5%  Consistent with diabetes . This assay result is consistent with a decreased risk of diabetes. . Currently, no consensus exists regarding use of hemoglobin A1c for diagnosis of diabetes in children. . According to American Diabetes Association (ADA) guidelines, hemoglobin A1c <7.0% represents optimal control in non-pregnant diabetic patients. Different metrics may apply to specific patient populations.  Standards of Medical Care in Diabetes(ADA). .   . Mean Plasma Glucose 07/09/2017 111  (calc) Final  . eAG (mmol/L) 07/09/2017 6.2  (calc) Final   Past Medical History:  Diagnosis Date  . Anemia   . Arthritis    right hand  . Cancer ([place])    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  . Colon polyps   . Hyperlipidemia   . Prediabetes    Past Surgical History:  Procedure Laterality Date  . CERVICAL CONIZATION W/BX  1978  . CHOLECYSTECTOMY    . COLONOSCOPY    . SKIN CANCER DESTRUCTION  07/29/06   basal cell   . TONSILLECTOMY AND ADENOIDECTOMY  1967   No current outpatient medications on file prior to visit.   No current facility-administered medications on file prior to visit.    No Known Allergies Social History   Socioeconomic History  . Marital status: Married    Spouse name: Not on file  . Number of children: Not on file  . Years of education: Not on file  . Highest education level: Not on file  Social Needs  . Financial resource strain: Not on file  . Food insecurity - worry: Not on file  . Food insecurity - inability: Not on file  . Transportation needs - medical: Not on file  . Transportation needs - non-medical: Not on file  Occupational History  . Not on file  Tobacco Use  . Smoking status: Former Smoker    Last attempt to quit: 10/27/2003    Years since quitting: 13.7  . Smokeless tobacco: Never Used  Substance and Sexual Activity  . Alcohol use: Yes    Comment: 1-2 wine a month  . Drug use: No  . Sexual activity: Yes    Comment: married to [place], retired  Other Topics Concern  . Not on file  Social History Narrative  . Not on file   Family History  Problem Relation Age of Onset  . Colon cancer Neg Hx   . Esophageal cancer Neg Hx   . Rectal cancer Neg Hx   . Stomach cancer Neg Hx     Review of Systems  All other systems reviewed and are negative.      Objective:   Physical Exam  Constitutional: She is oriented to person, place, and time. She appears well-developed and well-nourished. No distress.  HENT:  Head: Normocephalic and atraumatic.  Right Ear: External ear normal.  Left Ear: External ear normal.  Nose: Nose normal.  Mouth/Throat: Oropharynx is clear and moist. No oropharyngeal exudate.  Eyes: Conjunctivae and EOM are normal. Pupils are equal, round, and reactive to light. Right eye exhibits no discharge. Left eye exhibits no discharge. No scleral icterus.  Neck: Normal range of motion. Neck supple. No JVD present. No tracheal deviation  present. No thyromegaly present.  Cardiovascular: Normal rate, regular rhythm, normal heart sounds and intact distal pulses. Exam reveals no gallop and no friction rub.  No murmur heard. Pulmonary/Chest: Effort normal and breath sounds normal. No stridor. No respiratory distress. She has no wheezes. She has no rales. She exhibits no tenderness.  Abdominal: Soft. Bowel sounds are normal. She exhibits no distension and no mass. There is no tenderness. There is no rebound and no guarding.  Musculoskeletal: Normal range of motion. She exhibits no edema or tenderness.  Lymphadenopathy:    She has no cervical adenopathy.  Neurological: She is alert and oriented to person, place, and time. She has normal reflexes. No cranial nerve deficit. She exhibits normal muscle tone. Coordination normal.  Skin: Skin is warm. No rash noted. She is not diaphoretic. No erythema. No pallor.  Psychiatric: She has a normal mood and affect. Her behavior is normal. Judgment and thought content normal.  Vitals reviewed. Tender to palpation over the left greater trochanteric bursa        Assessment & Plan:  Routine general medical examination at a health care facility  Prediabetes  Patient's physical exam today is completely normal.  Lab work is concerning for prediabetes.  We continue to discuss a low carbohydrate diet for prediabetes and regular aerobic exercise.  Her mammogram is up-to-date.  I will schedule her for a bone density.  Her colonoscopy is not due again until 2021.  Her Pap smear was performed last year and was normal and therefore she does not require another.  Her immunizations are up-to-date aside from the shingles vaccine/ Shingrix but I advised her to check on the price first.  She does have pain over the left trochanteric bursa.  I have recommended an orthopedics consultation for cortisone injection.  I will schedule this as well

## 2017-07-29 DIAGNOSIS — M7061 Trochanteric bursitis, right hip: Secondary | ICD-10-CM

## 2017-07-29 DIAGNOSIS — M7062 Trochanteric bursitis, left hip: Secondary | ICD-10-CM

## 2017-07-29 HISTORY — DX: Trochanteric bursitis, right hip: M70.61

## 2017-07-29 HISTORY — DX: Trochanteric bursitis, left hip: M70.62

## 2017-07-31 DIAGNOSIS — H2512 Age-related nuclear cataract, left eye: Secondary | ICD-10-CM | POA: Diagnosis not present

## 2017-08-01 ENCOUNTER — Ambulatory Visit
Admission: RE | Admit: 2017-08-01 | Discharge: 2017-08-01 | Disposition: A | Discharge status: Home / Self Care | Payer: Medicare Other | Source: Ambulatory Visit | Attending: Family Medicine | Admitting: Family Medicine

## 2017-08-01 DIAGNOSIS — Z780 Asymptomatic menopausal state: Secondary | ICD-10-CM

## 2017-08-01 DIAGNOSIS — M85851 Other specified disorders of bone density and structure, right thigh: Secondary | ICD-10-CM | POA: Diagnosis not present

## 2017-08-04 ENCOUNTER — Encounter (INDEPENDENT_AMBULATORY_CARE_PROVIDER_SITE_OTHER): Payer: Self-pay | Admitting: Orthopaedic Surgery

## 2017-08-04 ENCOUNTER — Ambulatory Visit (INDEPENDENT_AMBULATORY_CARE_PROVIDER_SITE_OTHER): Payer: Medicare Other

## 2017-08-04 ENCOUNTER — Encounter: Payer: Self-pay | Admitting: Family Medicine

## 2017-08-04 ENCOUNTER — Ambulatory Visit (INDEPENDENT_AMBULATORY_CARE_PROVIDER_SITE_OTHER): Payer: Medicare Other | Admitting: Orthopaedic Surgery

## 2017-08-04 DIAGNOSIS — M7062 Trochanteric bursitis, left hip: Secondary | ICD-10-CM | POA: Diagnosis not present

## 2017-08-04 DIAGNOSIS — M7061 Trochanteric bursitis, right hip: Secondary | ICD-10-CM | POA: Diagnosis not present

## 2017-08-04 MED ORDER — BUPIVACAINE HCL 0.25 % IJ SOLN
2.0000 mL | INTRAMUSCULAR | Status: AC | PRN
  Administered 2017-08-04: 2 mL via INTRA_ARTICULAR

## 2017-08-04 MED ORDER — METHYLPREDNISOLONE ACETATE 40 MG/ML IJ SUSP
40.0000 mg | INTRAMUSCULAR | Status: AC | PRN
  Administered 2017-08-04: 40 mg via INTRA_ARTICULAR

## 2017-08-04 MED ORDER — LIDOCAINE HCL 1 % IJ SOLN
3.0000 mL | INTRAMUSCULAR | Status: AC | PRN
  Administered 2017-08-04: 3 mL

## 2017-08-04 NOTE — Progress Notes (Signed)
Office Visit Note   Patient: Donna Conner           Date of Birth: January 27, 1951           MRN: [id number] Visit Date: 08/04/2017              Requested by: Susy Frizzle, MD [id number] [facility] [place], [place] [address] PCP: Susy Frizzle, MD   Assessment & Plan: Visit Diagnoses:  1. Trochanteric bursitis of both hips     Plan: Impression bilateral hip trochanteric bursitis.  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  The main issue is coming from bilateral trochanteric bursitis.  We are going to proceed with bilateral cortisone injections with hopes of settling things down.  We are also going to instruct her on iliotibial band exercise program.  Lari will follow up with [place] in 1 years time to repeat x-rays of both hips due to protrusio deformity.  Follow-Up Instructions: Return in about 1 year (around 08/04/2018) for repeat xray of both hips.   Orders:  Orders Placed This Encounter  Procedures  . Large Joint Inj  . XR HIP UNILAT W OR W/O PELVIS 2-3 VIEWS LEFT   No orders of the defined types were placed in this encounter.     Procedures: Bilateral trochanteric bursa Large Joint Inj on 08/04/2017 9:56 AM Indications: pain Details: 22 G needle, lateral approach Medications: 3 mL lidocaine 1 %; 2 mL bupivacaine 0.25 %; 40 mg methylPREDNISolone acetate 40 MG/ML      Clinical Data: No additional findings.   Subjective: Chief Complaint  Patient presents with  . Left Hip - Pain    HPI this is a pleasant 67 year old female who presents to our clinic today with bilateral hip pain both equally bad.  This began several years ago without any known injury or change in activity.  All of her pain is lateral aspect both sides.  No anterior thigh or groin pain.  No posterior leg or buttocks pain.  The pain she has really only occurs when she is sleeping.  Occasionally she will get this pain with exercise but is unsure which exercises exacerbate the  pain.  No numbness tingling burning to either lower extremity.  Review of Systems as detailed in HPI.  All others reviewed and are negative.   Objective: Vital Signs: There were no vitals taken for this visit.  Physical Exam well-developed well-nourished female in no acute distress.  Alert and oriented x3.  Ortho Exam examination of both lower extremities reveals marked tenderness over the trochanteric bursa both sides.  Negative logroll negative Corky Sox negative Stinchfield.  Negative straight leg raise..  She is neurovascular intact distally  Specialty Comments:  No specialty comments available.  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    PMFS History: Patient Active Problem List   Diagnosis Date Noted  . Trochanteric bursitis of both hips 08/04/2017  . History of abnormal cervical Pap smear 08/19/2016  . Prediabetes   . Colon polyps   . Hyperlipidemia    Past Medical History:  Diagnosis Date  . Anemia   . Arthritis    right hand  . 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 Colon polyps   . Hyperlipidemia   . Prediabetes  Family History  Problem Relation Age of Onset  . Colon cancer Neg Hx   . Esophageal cancer Neg Hx   . Rectal cancer Neg Hx   . Stomach cancer Neg Hx     Past Surgical History:  Procedure Laterality Date  . CERVICAL CONIZATION W/BX  1978  . CHOLECYSTECTOMY    . COLONOSCOPY    . SKIN CANCER DESTRUCTION  07/29/06   basal cell  . TONSILLECTOMY AND ADENOIDECTOMY  1967   Social History   Occupational History  . Not on file  Tobacco Use  . Smoking status: Former Smoker    Last attempt to quit: 10/27/2003    Years since quitting: 13.7  . Smokeless tobacco: Never Used  Substance and Sexual Activity  . Alcohol use: Yes    Comment: 1-2 wine a month  . Drug use:  No  . Sexual activity: Yes    Comment: married to [place], retired

## 2017-08-05 ENCOUNTER — Encounter: Payer: Self-pay | Admitting: *Deleted

## 2017-08-07 ENCOUNTER — Ambulatory Visit: Payer: Medicare Other | Admitting: Anesthesiology

## 2017-08-07 ENCOUNTER — Ambulatory Visit
Admission: RE | Admit: 2017-08-07 | Discharge: 2017-08-07 | Disposition: A | Discharge status: Home / Self Care | Payer: Medicare Other | Source: Ambulatory Visit | Attending: Ophthalmology | Admitting: Ophthalmology

## 2017-08-07 ENCOUNTER — Encounter
Admission: RE | Disposition: A | Discharge status: Home / Self Care | Payer: Self-pay | Source: Ambulatory Visit | Attending: Ophthalmology

## 2017-08-07 DIAGNOSIS — M19041 Primary osteoarthritis, right hand: Secondary | ICD-10-CM | POA: Insufficient documentation

## 2017-08-07 DIAGNOSIS — Z87891 Personal history of nicotine dependence: Secondary | ICD-10-CM | POA: Diagnosis not present

## 2017-08-07 DIAGNOSIS — H2512 Age-related nuclear cataract, left eye: Secondary | ICD-10-CM | POA: Diagnosis not present

## 2017-08-07 DIAGNOSIS — E785 Hyperlipidemia, unspecified: Secondary | ICD-10-CM | POA: Diagnosis not present

## 2017-08-07 DIAGNOSIS — R7303 Prediabetes: Secondary | ICD-10-CM | POA: Insufficient documentation

## 2017-08-07 DIAGNOSIS — Z85828 Personal history of other malignant neoplasm of skin: Secondary | ICD-10-CM | POA: Diagnosis not present

## 2017-08-07 HISTORY — DX: Trochanteric bursitis, left hip: M70.62

## 2017-08-07 HISTORY — PX: CATARACT EXTRACTION W/PHACO: SHX586

## 2017-08-07 HISTORY — DX: Trochanteric bursitis, right hip: M70.61

## 2017-08-07 LAB — GLUCOSE, CAPILLARY: GLUCOSE-CAPILLARY: 78 mg/dL (ref 65–99)

## 2017-08-07 SURGERY — PHACOEMULSIFICATION, CATARACT, WITH IOL INSERTION
Anesthesia: Monitor Anesthesia Care | Site: Eye | Laterality: Left | Wound class: Clean

## 2017-08-07 MED ORDER — POVIDONE-IODINE 5 % OP SOLN
OPHTHALMIC | Status: AC
  Filled 2017-08-07: qty 30

## 2017-08-07 MED ORDER — FENTANYL CITRATE (PF) 100 MCG/2ML IJ SOLN
INTRAMUSCULAR | Status: AC
  Filled 2017-08-07: qty 2

## 2017-08-07 MED ORDER — LIDOCAINE HCL (PF) 4 % IJ SOLN
INTRAMUSCULAR | Status: AC
  Filled 2017-08-07: qty 5

## 2017-08-07 MED ORDER — ARMC OPHTHALMIC DILATING DROPS
OPHTHALMIC | Status: AC
  Administered 2017-08-07: 1 via OPHTHALMIC
  Filled 2017-08-07: qty 0.4

## 2017-08-07 MED ORDER — MOXIFLOXACIN HCL 0.5 % OP SOLN
OPHTHALMIC | Status: AC
  Filled 2017-08-07: qty 3

## 2017-08-07 MED ORDER — POVIDONE-IODINE 5 % OP SOLN
OPHTHALMIC | Status: DC | PRN
  Administered 2017-08-07: 1 via OPHTHALMIC

## 2017-08-07 MED ORDER — BSS IO SOLN
INTRAOCULAR | Status: DC | PRN
  Administered 2017-08-07: 2 mL via OPHTHALMIC

## 2017-08-07 MED ORDER — SODIUM HYALURONATE 23 MG/ML IO SOLN
INTRAOCULAR | Status: AC
  Filled 2017-08-07: qty 0.6

## 2017-08-07 MED ORDER — BSS IO SOLN
INTRAOCULAR | Status: DC | PRN
  Administered 2017-08-07: 1 via INTRAOCULAR

## 2017-08-07 MED ORDER — SODIUM HYALURONATE 10 MG/ML IO SOLN
INTRAOCULAR | Status: DC | PRN
  Administered 2017-08-07: 0.55 mL via INTRAOCULAR

## 2017-08-07 MED ORDER — FENTANYL CITRATE (PF) 100 MCG/2ML IJ SOLN
INTRAMUSCULAR | Status: DC | PRN
  Administered 2017-08-07: 25 ug via INTRAVENOUS
  Administered 2017-08-07: 75 ug via INTRAVENOUS

## 2017-08-07 MED ORDER — ARMC OPHTHALMIC DILATING DROPS
1.0000 "application " | OPHTHALMIC | Status: AC | PRN
  Administered 2017-08-07 (×3): 1 via OPHTHALMIC

## 2017-08-07 MED ORDER — MOXIFLOXACIN HCL 0.5 % OP SOLN: 1.0000 [drp] | Freq: Once | OPHTHALMIC | Status: DC

## 2017-08-07 MED ORDER — MOXIFLOXACIN HCL 0.5 % OP SOLN
OPHTHALMIC | Status: DC | PRN
  Administered 2017-08-07: 0.2 mL via OPHTHALMIC

## 2017-08-07 MED ORDER — EPINEPHRINE PF 1 MG/ML IJ SOLN
INTRAMUSCULAR | Status: AC
  Filled 2017-08-07: qty 1

## 2017-08-07 MED ORDER — SODIUM HYALURONATE 23 MG/ML IO SOLN
INTRAOCULAR | Status: DC | PRN
  Administered 2017-08-07: 0.6 mL via INTRAOCULAR

## 2017-08-07 MED ORDER — SODIUM CHLORIDE 0.9 % IV SOLN
INTRAVENOUS | Status: DC
  Administered 2017-08-07: 07:00:00 via INTRAVENOUS

## 2017-08-07 SURGICAL SUPPLY — 16 items
DISSECTOR HYDRO NUCLEUS 50X22 (MISCELLANEOUS) ×2 IMPLANT
GLOVE BIO SURGEON STRL SZ8 (GLOVE) ×2 IMPLANT
GLOVE BIOGEL M 6.5 STRL (GLOVE) ×2 IMPLANT
GLOVE SURG LX 7.5 STRW (GLOVE) ×1
GLOVE SURG LX STRL 7.5 STRW (GLOVE) ×1 IMPLANT
GOWN STRL REUS W/ TWL LRG LVL3 (GOWN DISPOSABLE) ×2 IMPLANT
GOWN STRL REUS W/TWL LRG LVL3 (GOWN DISPOSABLE) ×2
LABEL CATARACT MEDS ST (LABEL) ×2 IMPLANT
LENS IOL TECNIS ITEC 19.5 (Intraocular Lens) ×2 IMPLANT
PACK CATARACT (MISCELLANEOUS) ×2 IMPLANT
PACK CATARACT KING (MISCELLANEOUS) ×2 IMPLANT
PACK EYE AFTER SURG (MISCELLANEOUS) ×2 IMPLANT
SOL BSS BAG (MISCELLANEOUS) ×2
SOLUTION BSS BAG (MISCELLANEOUS) ×1 IMPLANT
WATER STERILE IRR 250ML POUR (IV SOLUTION) ×2 IMPLANT
WIPE NON LINTING 3.25X3.25 (MISCELLANEOUS) ×2 IMPLANT

## 2017-08-07 NOTE — Op Note (Signed)
OPERATIVE NOTE  Donna Conner [id number] 08/07/2017   PREOPERATIVE DIAGNOSIS:  Nuclear sclerotic cataract left eye.  H25.12   POSTOPERATIVE DIAGNOSIS:    Nuclear sclerotic cataract left eye.     PROCEDURE:  Phacoemusification with posterior chamber intraocular lens placement of the left eye   LENS:   Implant Name Type Inv. Item Serial No. Manufacturer Lot No. LRB No. Used  LENS IOL DIOP 19.5 - [id number] Intraocular Lens LENS IOL DIOP 19.5 [id number]  Left 1       PCB00 +19.5   ULTRASOUND TIME: 0 minutes 50.4 seconds.  CDE 6.78   SURGEON:  Benay Pillow, MD, MPH   ANESTHESIA:  Topical with tetracaine drops augmented with 1% preservative-free intracameral lidocaine.  ESTIMATED BLOOD LOSS: <1 mL   COMPLICATIONS:  None.   DESCRIPTION OF PROCEDURE:  The patient was identified in the holding room and transported to the operating room and placed in the supine position under the operating microscope.  The left eye was identified as the operative eye and it was prepped and draped in the usual sterile ophthalmic fashion.   A 1.0 millimeter clear-corneal paracentesis was made at the 5:00 position. 0.5 ml of preservative-free 1% lidocaine with epinephrine was injected into the anterior chamber.  The anterior chamber was filled with Healon 5 viscoelastic.  A 2.4 millimeter keratome was used to make a near-clear corneal incision at the 2:00 position.  A curvilinear capsulorrhexis was made with a cystotome and capsulorrhexis forceps.  Balanced salt solution was used to hydrodissect and hydrodelineate the nucleus.   Phacoemulsification was then used in stop and chop fashion to remove the lens nucleus and epinucleus.  The remaining cortex was then removed using the irrigation and aspiration handpiece. Healon was then placed into the capsular bag to distend it for lens placement.  A lens was then injected into the capsular bag.  The remaining viscoelastic was aspirated.   Wounds were hydrated  with balanced salt solution.  The anterior chamber was inflated to a physiologic pressure with balanced salt solution.  Intracameral vigamox 0.1 mL undiltued was injected into the eye and a drop placed onto the ocular surface.  No wound leaks were noted.  The patient was taken to the recovery room in stable condition without complications of anesthesia or surgery  Benay Pillow 08/07/2017, 8:30 AM

## 2017-08-07 NOTE — Anesthesia Postprocedure Evaluation (Signed)
Anesthesia Post Note  Patient: Donna Conner  Procedure(s) Performed: CATARACT EXTRACTION PHACO AND INTRAOCULAR LENS PLACEMENT (IOC) (Left Eye)  Patient location during evaluation: PACU Anesthesia Type: MAC Level of consciousness: awake and alert and oriented Pain management: pain level controlled Vital Signs Assessment: post-procedure vital signs reviewed and stable Respiratory status: spontaneous breathing, nonlabored ventilation and respiratory function stable Cardiovascular status: blood pressure returned to baseline and stable Postop Assessment: no signs of nausea or vomiting Anesthetic complications: no     Last Vitals:  Vitals:   08/07/17 0842 08/07/17 0846  BP: (!) 150/70 (!) 160/84  Pulse: 80 72  Resp: 16 16  Temp: 36.7 C   SpO2: 96% 100%    Last Pain:  Vitals:   08/07/17 0842  TempSrc: Oral                 Lucette Kratz

## 2017-08-07 NOTE — Anesthesia Preprocedure Evaluation (Signed)
Anesthesia Evaluation  Patient identified by MRN, date of birth, ID band Patient awake    Reviewed: Allergy & Precautions, NPO status , Patient's Chart, lab work & pertinent test results  History of Anesthesia Complications Negative for: history of anesthetic complications  Airway Mallampati: III  TM Distance: >3 FB Neck ROM: Full    Dental  (+) Implants   Pulmonary neg sleep apnea, neg COPD, former smoker,    breath sounds clear to auscultation- rhonchi (-) wheezing      Cardiovascular Exercise Tolerance: Good (-) hypertension(-) CAD, (-) Past MI and (-) Cardiac Stents  Rhythm:Regular Rate:Normal - Systolic murmurs and - Diastolic murmurs    Neuro/Psych negative neurological ROS  negative psych ROS   GI/Hepatic negative GI ROS, Neg liver ROS,   Endo/Other  negative endocrine ROSneg diabetes  Renal/GU negative Renal ROS     Musculoskeletal  (+) Arthritis ,   Abdominal (+) - obese,   Peds  Hematology  (+) anemia ,   Anesthesia Other Findings Past Medical History: No date: Anemia No date: Arthritis     Comment:  right hand No date: Cancer ([place])     Comment:  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 No date: Colon polyps No date: Hyperlipidemia No date: Prediabetes 07/2017: Trochanteric bursitis of both hips     Comment:  received cortisone injections bilaterally 07/2017: Trochanteric bursitis, left hip   Reproductive/Obstetrics                             Anesthesia Physical Anesthesia Plan  ASA: II  Anesthesia Plan: MAC   Post-op Pain Management:    Induction: Intravenous  PONV Risk Score and Plan: 2 and Midazolam  Airway Management Planned: Natural Airway  Additional Equipment:   Intra-op Plan:   Post-operative Plan:   Informed Consent: I have reviewed the patients History and Physical, chart, labs and discussed the procedure including the risks, benefits and  alternatives for the proposed anesthesia with the patient or authorized representative who has indicated his/her understanding and acceptance.     Plan Discussed with: CRNA and Anesthesiologist  Anesthesia Plan Comments:         Anesthesia Quick Evaluation

## 2017-08-07 NOTE — Progress Notes (Signed)
Data collected at 8:36

## 2017-08-07 NOTE — Transfer of Care (Signed)
Immediate Anesthesia Transfer of Care Note  Patient: Donna Conner  Procedure(s) Performed: CATARACT EXTRACTION PHACO AND INTRAOCULAR LENS PLACEMENT (IOC) (Left Eye)  Patient Location: Short Stay  Anesthesia Type:MAC  Level of Consciousness: awake, alert  and oriented  Airway & Oxygen Therapy: Patient Spontanous Breathing  Post-op Assessment: Report given to RN and Post -op Vital signs reviewed and stable  Post vital signs: Reviewed and stable  Last Vitals:  Vitals:   08/07/17 0635  BP: (!) 151/77  Pulse: 86  Resp: 16  Temp: (!) 34.8 C  SpO2: 95%    Last Pain:  Vitals:   08/07/17 0635  TempSrc: Tympanic         Complications: No apparent anesthesia complications

## 2017-08-07 NOTE — H&P (Signed)
The History and Physical notes are on paper, have been signed, and are to be scanned.   I have examined the patient and there are no changes to the H&P.   Donna Conner 08/07/2017 7:56 AM

## 2017-08-07 NOTE — Discharge Instructions (Signed)
Eye Surgery Discharge Instructions  Expect mild scratchy sensation or mild soreness. DO NOT RUB YOUR EYE!  The day of surgery:  Minimal physical activity, but bed rest is not required  No reading, computer work, or close hand work  No bending, lifting, or straining.  May watch TV  For 24 hours:  No driving, legal decisions, or alcoholic beverages  Safety precautions  Eat anything you prefer: It is better to start with liquids, then soup then solid foods.  _____ Eye patch should be worn until postoperative exam tomorrow.  ____ Solar shield eyeglasses should be worn for comfort in the sunlight/patch while sleeping  Resume all regular medications including aspirin or Coumadin if these were discontinued prior to surgery. You may shower, bathe, shave, or wash your hair. Tylenol may be taken for mild discomfort.  Call your doctor if you experience significant pain, nausea, or vomiting, fever > 101 or other signs of infection. [phone] or [phone] Specific instructions:  Follow-up Information    Eulogio Bear, MD Follow up on 08/08/2017.   Specialty:  Ophthalmology Why:  10:15 Contact information: [place] [id number] [phone]

## 2017-08-07 NOTE — Anesthesia Procedure Notes (Signed)
Procedure Name: MAC Performed by: Demetrius Charity, CRNA Pre-anesthesia Checklist: Patient identified, Emergency Drugs available, Suction available, Patient being monitored and Timeout performed Oxygen Delivery Method: Nasal cannula

## 2017-08-07 NOTE — Anesthesia Post-op Follow-up Note (Signed)
Anesthesia QCDR form completed.        

## 2017-08-21 DIAGNOSIS — H2511 Age-related nuclear cataract, right eye: Secondary | ICD-10-CM | POA: Diagnosis not present

## 2017-08-25 ENCOUNTER — Encounter: Payer: Self-pay | Admitting: Family Medicine

## 2017-08-25 ENCOUNTER — Encounter: Payer: Self-pay | Admitting: *Deleted

## 2017-08-27 DIAGNOSIS — H2511 Age-related nuclear cataract, right eye: Secondary | ICD-10-CM | POA: Diagnosis not present

## 2017-08-28 ENCOUNTER — Ambulatory Visit: Payer: Medicare Other | Admitting: Anesthesiology

## 2017-08-28 ENCOUNTER — Ambulatory Visit
Admission: RE | Admit: 2017-08-28 | Discharge: 2017-08-28 | Disposition: A | Discharge status: Home / Self Care | Payer: Medicare Other | Source: Ambulatory Visit | Attending: Ophthalmology | Admitting: Ophthalmology

## 2017-08-28 ENCOUNTER — Encounter
Admission: RE | Disposition: A | Discharge status: Home / Self Care | Payer: Self-pay | Source: Ambulatory Visit | Attending: Ophthalmology

## 2017-08-28 ENCOUNTER — Encounter: Payer: Self-pay | Admitting: Emergency Medicine

## 2017-08-28 DIAGNOSIS — I10 Essential (primary) hypertension: Secondary | ICD-10-CM | POA: Diagnosis not present

## 2017-08-28 DIAGNOSIS — H2511 Age-related nuclear cataract, right eye: Secondary | ICD-10-CM | POA: Insufficient documentation

## 2017-08-28 DIAGNOSIS — E119 Type 2 diabetes mellitus without complications: Secondary | ICD-10-CM | POA: Diagnosis not present

## 2017-08-28 DIAGNOSIS — H2512 Age-related nuclear cataract, left eye: Secondary | ICD-10-CM | POA: Diagnosis not present

## 2017-08-28 DIAGNOSIS — Z87891 Personal history of nicotine dependence: Secondary | ICD-10-CM | POA: Insufficient documentation

## 2017-08-28 HISTORY — PX: CATARACT EXTRACTION W/PHACO: SHX586

## 2017-08-28 SURGERY — PHACOEMULSIFICATION, CATARACT, WITH IOL INSERTION
Anesthesia: Monitor Anesthesia Care | Site: Eye | Laterality: Right | Wound class: Clean

## 2017-08-28 MED ORDER — ARMC OPHTHALMIC DILATING DROPS
1.0000 "application " | OPHTHALMIC | Status: DC
  Administered 2017-08-28 (×3): 1 via OPHTHALMIC

## 2017-08-28 MED ORDER — LIDOCAINE HCL (PF) 4 % IJ SOLN
INTRAMUSCULAR | Status: AC
Start: 2017-08-28 — End: 2017-08-28
  Filled 2017-08-28: qty 5

## 2017-08-28 MED ORDER — SODIUM HYALURONATE 10 MG/ML IO SOLN
INTRAOCULAR | Status: DC | PRN
  Administered 2017-08-28: 0.55 mL via INTRAOCULAR

## 2017-08-28 MED ORDER — CARBACHOL 0.01 % IO SOLN
INTRAOCULAR | Status: DC | PRN
  Administered 2017-08-28: 0.5 mL via INTRAOCULAR

## 2017-08-28 MED ORDER — POVIDONE-IODINE 5 % OP SOLN
OPHTHALMIC | Status: AC
  Filled 2017-08-28: qty 30

## 2017-08-28 MED ORDER — MOXIFLOXACIN HCL 0.5 % OP SOLN
OPHTHALMIC | Status: DC | PRN
  Administered 2017-08-28: 0.2 mL via OPHTHALMIC

## 2017-08-28 MED ORDER — SODIUM HYALURONATE 23 MG/ML IO SOLN
INTRAOCULAR | Status: AC
  Filled 2017-08-28: qty 0.6

## 2017-08-28 MED ORDER — LIDOCAINE HCL (PF) 4 % IJ SOLN
INTRAOCULAR | Status: DC | PRN
  Administered 2017-08-28: 4 mL via OPHTHALMIC

## 2017-08-28 MED ORDER — MIDAZOLAM HCL 2 MG/2ML IJ SOLN
INTRAMUSCULAR | Status: AC
  Filled 2017-08-28: qty 2

## 2017-08-28 MED ORDER — POVIDONE-IODINE 5 % OP SOLN
OPHTHALMIC | Status: DC | PRN
  Administered 2017-08-28: 1 via OPHTHALMIC

## 2017-08-28 MED ORDER — MIDAZOLAM HCL 2 MG/2ML IJ SOLN
INTRAMUSCULAR | Status: DC | PRN
  Administered 2017-08-28 (×2): 1 mg via INTRAVENOUS

## 2017-08-28 MED ORDER — SODIUM CHLORIDE 0.9 % IV SOLN
INTRAVENOUS | Status: DC
  Administered 2017-08-28: 09:00:00 via INTRAVENOUS

## 2017-08-28 MED ORDER — MOXIFLOXACIN HCL 0.5 % OP SOLN
1.0000 [drp] | OPHTHALMIC | Status: DC | PRN
Start: 2017-08-28 — End: 2017-08-28

## 2017-08-28 MED ORDER — SODIUM HYALURONATE 23 MG/ML IO SOLN
INTRAOCULAR | Status: DC | PRN
  Administered 2017-08-28: 0.6 mL via INTRAOCULAR

## 2017-08-28 MED ORDER — EPINEPHRINE PF 1 MG/ML IJ SOLN
INTRAOCULAR | Status: DC | PRN
  Administered 2017-08-28: 11:00:00 via OPHTHALMIC

## 2017-08-28 MED ORDER — EPINEPHRINE PF 1 MG/ML IJ SOLN
INTRAMUSCULAR | Status: AC
  Filled 2017-08-28: qty 2

## 2017-08-28 SURGICAL SUPPLY — 16 items
DISSECTOR HYDRO NUCLEUS 50X22 (MISCELLANEOUS) ×2 IMPLANT
GLOVE BIO SURGEON STRL SZ8 (GLOVE) ×2 IMPLANT
GLOVE BIOGEL M 6.5 STRL (GLOVE) ×2 IMPLANT
GLOVE SURG LX 7.5 STRW (GLOVE) ×1
GLOVE SURG LX STRL 7.5 STRW (GLOVE) ×1 IMPLANT
GOWN STRL REUS W/ TWL LRG LVL3 (GOWN DISPOSABLE) ×2 IMPLANT
GOWN STRL REUS W/TWL LRG LVL3 (GOWN DISPOSABLE) ×2
LABEL CATARACT MEDS ST (LABEL) ×2 IMPLANT
LENS IOL TECNIS ITEC 18.5 (Intraocular Lens) ×2 IMPLANT
PACK CATARACT (MISCELLANEOUS) ×2 IMPLANT
PACK CATARACT KING (MISCELLANEOUS) ×2 IMPLANT
PACK EYE AFTER SURG (MISCELLANEOUS) ×2 IMPLANT
SOL BSS BAG (MISCELLANEOUS) ×2
SOLUTION BSS BAG (MISCELLANEOUS) ×1 IMPLANT
WATER STERILE IRR 250ML POUR (IV SOLUTION) ×2 IMPLANT
WIPE NON LINTING 3.25X3.25 (MISCELLANEOUS) ×2 IMPLANT

## 2017-08-28 NOTE — Anesthesia Preprocedure Evaluation (Signed)
Anesthesia Evaluation  Patient identified by MRN, date of birth, ID band Patient awake    Reviewed: Allergy & Precautions, H&P , NPO status , Patient's Chart, lab work & pertinent test results, reviewed documented beta blocker date and time   Airway Mallampati: II  TM Distance: >3 FB Neck ROM: full    Dental no notable dental hx. (+) Teeth Intact   Pulmonary neg pulmonary ROS, former smoker,    Pulmonary exam normal breath sounds clear to auscultation       Cardiovascular Exercise Tolerance: Good hypertension, negative cardio ROS   Rhythm:regular Rate:Normal     Neuro/Psych negative neurological ROS  negative psych ROS   GI/Hepatic negative GI ROS, Neg liver ROS,   Endo/Other  negative endocrine ROSdiabetes  Renal/GU      Musculoskeletal   Abdominal   Peds  Hematology negative hematology ROS (+) anemia ,   Anesthesia Other Findings   Reproductive/Obstetrics negative OB ROS                             Anesthesia Physical Anesthesia Plan  ASA: II  Anesthesia Plan: MAC   Post-op Pain Management:    Induction:   PONV Risk Score and Plan: 2  Airway Management Planned:   Additional Equipment:   Intra-op Plan:   Post-operative Plan:   Informed Consent: I have reviewed the patients History and Physical, chart, labs and discussed the procedure including the risks, benefits and alternatives for the proposed anesthesia with the patient or authorized representative who has indicated his/her understanding and acceptance.     Plan Discussed with: CRNA  Anesthesia Plan Comments:         Anesthesia Quick Evaluation

## 2017-08-28 NOTE — OR Nursing (Signed)
Iv removed from left hand with cath intact

## 2017-08-28 NOTE — Op Note (Signed)
OPERATIVE NOTE  Donna Conner [id number] 08/28/2017   PREOPERATIVE DIAGNOSIS:  Nuclear sclerotic cataract right eye.  H25.11   POSTOPERATIVE DIAGNOSIS:    Nuclear sclerotic cataract right eye.     PROCEDURE:  Phacoemusification with posterior chamber intraocular lens placement of the right eye   LENS:   Implant Name Type Inv. Item Serial No. Manufacturer Lot No. LRB No. Used  LENS IOL DIOP 18.5 - [id number] Intraocular Lens LENS IOL DIOP 18.5 [id number] AMO  Right 1       PCB00 +18.5   ULTRASOUND TIME: 0 minutes 28 seconds.  CDE 2.48   SURGEON:  Benay Pillow, MD, MPH  ANESTHESIOLOGIST: Anesthesiologist: Molli Barrows, MD CRNA: Eben Burow, CRNA   ANESTHESIA:  Topical with tetracaine drops augmented with 1% preservative-free intracameral lidocaine.  ESTIMATED BLOOD LOSS: less than 1 mL.   COMPLICATIONS:  None.   DESCRIPTION OF PROCEDURE:  The patient was identified in the holding room and transported to the operating room and placed in the supine position under the operating microscope.  The right eye was identified as the operative eye and it was prepped and draped in the usual sterile ophthalmic fashion.   A 1.0 millimeter clear-corneal paracentesis was made at the 10:30 position. 0.5 ml of preservative-free 1% lidocaine with epinephrine was injected into the anterior chamber.  The anterior chamber was filled with Healon 5 viscoelastic.  A 2.4 millimeter keratome was used to make a near-clear corneal incision at the 8:00 position.  A curvilinear capsulorrhexis was made with a cystotome and capsulorrhexis forceps.  Balanced salt solution was used to hydrodissect and hydrodelineate the nucleus.   Phacoemulsification was then used in stop and chop fashion to remove the lens nucleus and epinucleus.  The remaining cortex was then removed using the irrigation and aspiration handpiece. Healon was then placed into the capsular bag to distend it for lens placement.  A lens was then  injected into the capsular bag.  The remaining viscoelastic was aspirated.   Wounds were hydrated with balanced salt solution.  The anterior chamber was inflated to a physiologic pressure with balanced salt solution.   Intracameral vigamox 0.1 mL undiluted was injected into the eye and a drop placed onto the ocular surface.  No wound leaks were noted.  The patient was taken to the recovery room in stable condition without complications of anesthesia or surgery  Benay Pillow 08/28/2017, 10:56 AM

## 2017-08-28 NOTE — Anesthesia Post-op Follow-up Note (Signed)
Anesthesia QCDR form completed.        

## 2017-08-28 NOTE — Discharge Instructions (Signed)
Eye Surgery Discharge Instructions  Expect mild scratchy sensation or mild soreness. DO NOT RUB YOUR EYE!  The day of surgery:  Minimal physical activity, but bed rest is not required  No reading, computer work, or close hand work  No bending, lifting, or straining.  May watch TV  For 24 hours:  No driving, legal decisions, or alcoholic beverages  Safety precautions  Eat anything you prefer: It is better to start with liquids, then soup then solid foods.  _____ Eye patch should be worn until postoperative exam tomorrow.  ____ Solar shield eyeglasses should be worn for comfort in the sunlight/patch while sleeping  Resume all regular medications including aspirin or Coumadin if these were discontinued prior to surgery. You may shower, bathe, shave, or wash your hair. Tylenol may be taken for mild discomfort.  Call your doctor if you experience significant pain, nausea, or vomiting, fever > 101 or other signs of infection. [phone] or [phone] Specific instructions:  Follow-up Information    Eulogio Bear, MD Follow up on 08/29/2017.   Specialty:  Ophthalmology Why:  10:35 Contact information: [place] [address] [phone]

## 2017-08-28 NOTE — H&P (Signed)
The History and Physical notes are on paper, have been signed, and are to be scanned.   I have examined the patient and there are no changes to the H&P.   Benay Pillow 08/28/2017 10:26 AM

## 2017-08-28 NOTE — Transfer of Care (Signed)
Immediate Anesthesia Transfer of Care Note  Patient: Donna Conner  Procedure(s) Performed: CATARACT EXTRACTION PHACO AND INTRAOCULAR LENS PLACEMENT (IOC) (Right Eye)  Patient Location: Short Stay  Anesthesia Type:MAC  Level of Consciousness: awake, alert , oriented and patient cooperative  Airway & Oxygen Therapy: Patient Spontanous Breathing  Post-op Assessment: Report given to RN and Post -op Vital signs reviewed and stable  Post vital signs: Reviewed and stable  Last Vitals:  Vitals:   08/28/17 0913  BP: (!) 141/70  Pulse: 74  Resp: 17  Temp: 36.6 C  SpO2: 98%    Last Pain:  Vitals:   08/28/17 0913  TempSrc: Oral         Complications: No apparent anesthesia complications

## 2017-08-28 NOTE — Anesthesia Postprocedure Evaluation (Signed)
Anesthesia Post Note  Patient: Donna Conner  Procedure(s) Performed: CATARACT EXTRACTION PHACO AND INTRAOCULAR LENS PLACEMENT (IOC) (Right Eye)  Patient location during evaluation: Short Stay Anesthesia Type: MAC Level of consciousness: awake and alert, patient cooperative and oriented Pain management: satisfactory to patient Vital Signs Assessment: post-procedure vital signs reviewed and stable Respiratory status: spontaneous breathing and respiratory function stable Cardiovascular status: blood pressure returned to baseline Postop Assessment: no headache, no apparent nausea or vomiting and adequate PO intake Anesthetic complications: no     Last Vitals:  Vitals:   08/28/17 0913  BP: (!) 141/70  Pulse: 74  Resp: 17  Temp: 36.6 C  SpO2: 98%    Last Pain:  Vitals:   08/28/17 0913  TempSrc: Oral                 Eben Burow

## 2017-09-03 ENCOUNTER — Ambulatory Visit (INDEPENDENT_AMBULATORY_CARE_PROVIDER_SITE_OTHER): Payer: Medicare Other | Admitting: Podiatry

## 2017-09-03 ENCOUNTER — Encounter: Payer: Self-pay | Admitting: Podiatry

## 2017-09-03 DIAGNOSIS — I7300 Raynaud's syndrome without gangrene: Secondary | ICD-10-CM | POA: Diagnosis not present

## 2017-09-03 NOTE — Progress Notes (Signed)
Subjective:  Patient ID: Donna Conner, female    DOB: 1951-07-10,  MRN: [id number] HPI Chief Complaint  Patient presents with  . Callouses    Patient presents today for painful corns bilat 2nd toes that turned into blisters 1-2 months ago, she also has ? lesions on bottom of bilat great toes x 3 weeks and are sore to walk.  She reports she has used otc corn pads with some relief    67 y.o. female presents with the above complaint.     Past Medical History:  Diagnosis Date  . Anemia   . Arthritis    right hand  . 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 Colon polyps   . Hyperlipidemia   . Prediabetes   . Trochanteric bursitis of both hips 07/2017   received cortisone injections bilaterally  . Trochanteric bursitis, left hip 07/2017   Past Surgical History:  Procedure Laterality Date  . CATARACT EXTRACTION W/PHACO Left 08/07/2017   Procedure: CATARACT EXTRACTION PHACO AND INTRAOCULAR LENS PLACEMENT (IOC);  Surgeon: Eulogio Bear, MD;  Location: [facility] ORS;  Service: Ophthalmology;  Laterality: Left;  Lot# [id number] H [place]:   00:50.4 AP%:   13.4 CDE:    6.78  . CATARACT EXTRACTION W/PHACO Right 08/28/2017   Procedure: CATARACT EXTRACTION PHACO AND INTRAOCULAR LENS PLACEMENT (IOC);  Surgeon: Eulogio Bear, MD;  Location: [facility] ORS;  Service: Ophthalmology;  Laterality: Right;  [place] 00:28.3 AP% 8.8 CDE 2.48 Fluid Pack lot # [id number]  . CERVICAL CONIZATION W/BX  1978  . CHOLECYSTECTOMY    . COLONOSCOPY    . SKIN CANCER DESTRUCTION  07/29/06   basal cell  . TONSILLECTOMY AND ADENOIDECTOMY  1967    Current Outpatient Medications:  .  Cyanocobalamin (VITAMIN B 12 PO), Take by mouth., Disp: , Rfl:  .  acetaminophen (TYLENOL) 500 MG tablet, Take 500-1,000 mg by mouth every 6 (six) hours as needed for moderate pain or headache. , Disp: , Rfl:  .  Calcium Carbonate-Vitamin D (CALCIUM-VITAMIN D3 PO), Take 1 tablet by mouth daily., Disp: , Rfl:  .  NON FORMULARY,  Place 1 drop into the left eye 2 (two) times daily. ImprimisRX Eye Drop, Disp: , Rfl:   No Known Allergies Review of Systems  Skin:       Open sores on toes  All other systems reviewed and are negative.  Objective:  Vital signs are stable she is alert and oriented x3.   General: Well developed, nourished, in no acute distress, alert and oriented x3   Dermatological: Skin is warm, dry and supple bilateral. Nails x 10 are well maintained; remaining integument appears unremarkable at this time. There are no open sores, no preulcerative lesions, no rash or signs of infection present.  Mild areas of erythema with some skin mottling to the medial aspect of the second toes bilaterally.  These areas are tender to the touch but do not demonstrate any type of reactive hyperkeratosis.  There is also mottling of the lesser toes with every other toe being pink and cyanotic.  This is very much like a Raynaud's type phenomenon and the patient does go on to say that her feet feel much better when they are warm.  Vascular: Dorsalis Pedis artery and Posterior Tibial artery pedal pulses are 2/4 bilateral with immedate capillary fill time. Pedal hair growth present. No varicosities and no lower extremity edema present bilateral.   Neruologic: Grossly intact via light touch bilateral. Vibratory  intact via tuning fork bilateral. Protective threshold with Semmes Wienstein monofilament intact to all pedal sites bilateral. Patellar and Achilles deep tendon reflexes 2+ bilateral. No Babinski or clonus noted bilateral.   Musculoskeletal: No gross boney pedal deformities bilateral. No pain, crepitus, or limitation noted with foot and ankle range of motion bilateral. Muscular strength 5/5 in all groups tested bilateral.  Mild hammertoe deformities otherwise not complicated.  Gait: Unassisted, Nonantalgic.    Radiographs:  None taken  Assessment & Plan:   Assessment: Pain in her feet and toes are associated with  Raynaud's phenomenon.  Plan: Discussed etiology pathology conservative versus surgical therapies.  Placed padding between the toes of that they do not rub and become irritated and eventually develop into a skin irritation and breakdown.  At this point I feel that if she should follow-up with her primary care provider possibly a low dose calcium channel blocker may be of benefit.  We did talk about warmer shoes not going barefoot etc.     [place] Wank T. [place], [place]

## 2017-09-09 ENCOUNTER — Encounter: Payer: Self-pay | Admitting: Family Medicine

## 2017-09-11 ENCOUNTER — Encounter: Payer: Self-pay | Admitting: Ophthalmology

## 2017-09-11 NOTE — Addendum Note (Signed)
Addendum  created 09/11/17 1511 by Doreen Salvage, CRNA   Intraprocedure Event edited, Intraprocedure Staff edited

## 2017-09-15 ENCOUNTER — Encounter: Payer: Self-pay | Admitting: Family Medicine

## 2017-09-16 ENCOUNTER — Ambulatory Visit (INDEPENDENT_AMBULATORY_CARE_PROVIDER_SITE_OTHER): Payer: Medicare Other | Admitting: Family Medicine

## 2017-09-16 ENCOUNTER — Encounter: Payer: Self-pay | Admitting: Family Medicine

## 2017-09-16 VITALS — BP 122/68 | HR 76 | Temp 97.9°F | Resp 18 | Ht 64.5 in | Wt 155.0 lb

## 2017-09-16 DIAGNOSIS — I7300 Raynaud's syndrome without gangrene: Secondary | ICD-10-CM

## 2017-09-16 MED ORDER — AMLODIPINE BESYLATE 5 MG PO TABS: 5.0000 mg | ORAL_TABLET | Freq: Every day | ORAL | 3 refills | Status: DC

## 2017-09-16 NOTE — Progress Notes (Signed)
Subjective:    Patient ID: Donna Conner, female    DOB: 1951-05-27, 67 y.o.   MRN: [id number]  HPI Since I last saw the patient, she is seen a podiatrist who felt that she was experiencing Raynaud's phenomenon in her toes and recommended medication to treat this.  I have asked the patient to come in today for evaluation given the fact she had not mentioned this to me before. Patient has noticed that the tips of her fingers and the tips of her toes become cool to the touch and sometimes even light purple when it is cold outside.  When this occurs they sometimes burn and hurt.  It resolves rapidly as soon as they get warm.  This does not occur on a daily basis.  She has normal peripheral pulses.  There is no evidence of sores or skin breakdown on the tips of the toes due to ischemia.  She denies other symptoms of CREST syndrome.  Past Medical History:  Diagnosis Date  . Anemia   . Arthritis    right hand  . 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 Colon polyps   . Hyperlipidemia   . Prediabetes   . Trochanteric bursitis of both hips 07/2017   received cortisone injections bilaterally  . Trochanteric bursitis, left hip 07/2017   Past Surgical History:  Procedure Laterality Date  . CATARACT EXTRACTION W/PHACO Left 08/07/2017   Procedure: CATARACT EXTRACTION PHACO AND INTRAOCULAR LENS PLACEMENT (IOC);  Surgeon: Eulogio Bear, MD;  Location: [facility] ORS;  Service: Ophthalmology;  Laterality: Left;  Lot# [id number] H [place]:   00:50.4 AP%:   13.4 CDE:    6.78  . CATARACT EXTRACTION W/PHACO Right 08/28/2017   Procedure: CATARACT EXTRACTION PHACO AND INTRAOCULAR LENS PLACEMENT (IOC);  Surgeon: Eulogio Bear, MD;  Location: [facility] ORS;  Service: Ophthalmology;  Laterality: Right;  [place] 00:28.3 AP% 8.8 CDE 2.48 Fluid Pack lot # [id number]  . CERVICAL CONIZATION W/BX  1978  . CHOLECYSTECTOMY    . COLONOSCOPY    . SKIN CANCER DESTRUCTION  07/29/06   basal cell  . TONSILLECTOMY AND  ADENOIDECTOMY  1967   Current Outpatient Medications on File Prior to Visit  Medication Sig Dispense Refill  . acetaminophen (TYLENOL) 500 MG tablet Take 500-1,000 mg by mouth every 6 (six) hours as needed for moderate pain or headache.     . Calcium Carbonate-Vitamin D (CALCIUM-VITAMIN D3 PO) Take 1 tablet by mouth daily.    . Cyanocobalamin (VITAMIN B 12 PO) Take by mouth.     No current facility-administered medications on file prior to visit.    No Known Allergies Social History   Socioeconomic History  . Marital status: Married    Spouse name: Not on file  . Number of children: Not on file  . Years of education: Not on file  . Highest education level: Not on file  Social Needs  . Financial resource strain: Not on file  . Food insecurity - worry: Not on file  . Food insecurity - inability: Not on file  . Transportation needs - medical: Not on file  . Transportation needs - non-medical: Not on file  Occupational History  . Not on file  Tobacco Use  . Smoking status: Former Smoker    Last attempt to quit: 10/27/2003    Years since quitting: 13.8  . Smokeless tobacco: Never Used  Substance and Sexual Activity  . Alcohol use: No  Frequency: Never  . Drug use: No  . Sexual activity: Yes    Comment: married to [place], retired  Other Topics Concern  . Not on file  Social History Narrative  . Not on file      Review of Systems  All other systems reviewed and are negative.      Objective:   Physical Exam  Constitutional: She appears well-developed and well-nourished.  Neck: Neck supple. No JVD present.  Cardiovascular: Normal rate, regular rhythm, normal heart sounds and intact distal pulses.  No murmur heard. Pulses:      Dorsalis pedis pulses are 2+ on the right side, and 2+ on the left side.       Posterior tibial pulses are 2+ on the right side, and 2+ on the left side.  Pulmonary/Chest: Effort normal and breath sounds normal. No respiratory distress. She has  no wheezes. She has no rales.  Musculoskeletal: She exhibits no edema.  Vitals reviewed.         Assessment & Plan:  Raynaud's phenomenon without gangrene  I believe the patient has mild Raynaud's phenomenon.  We discussed the risk and benefits of amlodipine.  At the present time the patient feels that the situation is not severe enough to justify the medication and I agree with her.  If worsening she could try amlodipine 5 mg a day to see if it will help with vasospasms.

## 2018-01-22 ENCOUNTER — Ambulatory Visit (INDEPENDENT_AMBULATORY_CARE_PROVIDER_SITE_OTHER): Payer: Medicare Other | Admitting: Physician Assistant

## 2018-01-22 ENCOUNTER — Other Ambulatory Visit: Payer: Self-pay

## 2018-01-22 ENCOUNTER — Encounter: Payer: Self-pay | Admitting: Physician Assistant

## 2018-01-22 VITALS — BP 126/78 | HR 65 | Temp 98.1°F | Resp 14 | Ht 63.0 in | Wt 145.2 lb

## 2018-01-22 DIAGNOSIS — L239 Allergic contact dermatitis, unspecified cause: Secondary | ICD-10-CM

## 2018-01-22 MED ORDER — PREDNISONE 20 MG PO TABS: ORAL_TABLET | ORAL | 0 refills | Status: DC

## 2018-01-22 NOTE — Progress Notes (Signed)
    Patient ID: Donna Conner MRN: [id number], DOB: [birth year], 67 y.o. Date of Encounter: 01/22/2018, 11:12 AM    Chief Complaint:  Chief Complaint  Patient presents with  . Rash    on right arm      HPI: 67 y.o. year old female presents with above.   Reports that she first noticed this rash on her right upper arm on Sunday. Reports that since then it is getting worse.  Has spread some and "is getting weepy ".   Has applied some Benadryl cream but has used no other treatment. Has noticed one other area up towards the right armpit.  Has noticed no other areas of itching or rash.     Home Meds:   Outpatient Medications Prior to Visit  Medication Sig Dispense Refill  . acetaminophen (TYLENOL) 500 MG tablet Take 500-1,000 mg by mouth every 6 (six) hours as needed for moderate pain or headache.     . Calcium Carbonate-Vitamin D (CALCIUM-VITAMIN D3 PO) Take 1 tablet by mouth daily.    . Cyanocobalamin (VITAMIN B 12 PO) Take by mouth.    Marland Kitchen amLODipine (NORVASC) 5 MG tablet Take 1 tablet (5 mg total) by mouth daily. (Patient not taking: Reported on 01/22/2018) 30 tablet 3   No facility-administered medications prior to visit.     Allergies: No Known Allergies    Review of Systems: See HPI for pertinent ROS. All other ROS negative.    Physical Exam: Blood pressure 126/78, pulse 65, temperature 98.1 F (36.7 C), temperature source Oral, resp. rate 14, height 5\' 3"  (1.6 m), weight 65.9 kg (145 lb 3.2 oz), SpO2 100 %., Body mass index is 25.72 kg/m. General:  WNWD WF. Appears in no acute distress. Neck: Supple. No thyromegaly. No lymphadenopathy. Lungs: Clear bilaterally to auscultation without wheezes, rales, or rhonchi. Breathing is unlabored. Heart: Regular rhythm. No murmurs, rubs, or gallops. Msk:  Strength and tone normal for age. Extremities/Skin: Right upper arm at bicep area----there is papulovesicular rash in linear distribution.  Covers approximate 1 inch x 2 inch  area Neuro: Alert and oriented X 3. Moves all extremities spontaneously. Gait is normal. CNII-XII grossly in tact. Psych:  Responds to questions appropriately with a normal affect.     ASSESSMENT AND PLAN:  67 y.o. year old female with  1. Allergic dermatitis She is to take this oral prednisone taper as directed.  Take this in the mornings.  Recommend that she take oral Benadryl at night.  Follow-up if this rash does not resolve upon completion of the prednisone or if it reoccurs. - predniSONE (DELTASONE) 20 MG tablet; Take 3 daily for 2 days, then 2 daily for 2 days, then 1 daily for 2 days.  Dispense: 12 tablet; Refill: 0   Signed, [address] [place], [place], [facility] 01/22/2018 11:12 AM

## 2018-02-13 DIAGNOSIS — L570 Actinic keratosis: Secondary | ICD-10-CM | POA: Diagnosis not present

## 2018-02-13 DIAGNOSIS — L723 Sebaceous cyst: Secondary | ICD-10-CM | POA: Diagnosis not present

## 2018-02-13 DIAGNOSIS — L821 Other seborrheic keratosis: Secondary | ICD-10-CM | POA: Diagnosis not present

## 2018-03-23 ENCOUNTER — Emergency Department: Payer: Medicare Other

## 2018-03-23 ENCOUNTER — Other Ambulatory Visit: Payer: Self-pay

## 2018-03-23 ENCOUNTER — Emergency Department
Admission: EM | Admit: 2018-03-23 | Discharge: 2018-03-23 | Disposition: A | Discharge status: Home / Self Care | Payer: Medicare Other | Attending: Emergency Medicine | Admitting: Emergency Medicine

## 2018-03-23 ENCOUNTER — Encounter: Payer: Self-pay | Admitting: Emergency Medicine

## 2018-03-23 DIAGNOSIS — M25511 Pain in right shoulder: Secondary | ICD-10-CM | POA: Diagnosis not present

## 2018-03-23 DIAGNOSIS — Z85828 Personal history of other malignant neoplasm of skin: Secondary | ICD-10-CM | POA: Diagnosis not present

## 2018-03-23 DIAGNOSIS — Z87891 Personal history of nicotine dependence: Secondary | ICD-10-CM | POA: Insufficient documentation

## 2018-03-23 DIAGNOSIS — Y929 Unspecified place or not applicable: Secondary | ICD-10-CM | POA: Diagnosis not present

## 2018-03-23 DIAGNOSIS — W01198A Fall on same level from slipping, tripping and stumbling with subsequent striking against other object, initial encounter: Secondary | ICD-10-CM | POA: Diagnosis not present

## 2018-03-23 DIAGNOSIS — S50811A Abrasion of right forearm, initial encounter: Secondary | ICD-10-CM | POA: Diagnosis not present

## 2018-03-23 DIAGNOSIS — S40011A Contusion of right shoulder, initial encounter: Secondary | ICD-10-CM | POA: Insufficient documentation

## 2018-03-23 DIAGNOSIS — S4991XA Unspecified injury of right shoulder and upper arm, initial encounter: Secondary | ICD-10-CM | POA: Diagnosis not present

## 2018-03-23 DIAGNOSIS — Y999 Unspecified external cause status: Secondary | ICD-10-CM | POA: Diagnosis not present

## 2018-03-23 DIAGNOSIS — S50311A Abrasion of right elbow, initial encounter: Secondary | ICD-10-CM | POA: Diagnosis not present

## 2018-03-23 DIAGNOSIS — Z79899 Other long term (current) drug therapy: Secondary | ICD-10-CM | POA: Insufficient documentation

## 2018-03-23 DIAGNOSIS — Y939 Activity, unspecified: Secondary | ICD-10-CM | POA: Insufficient documentation

## 2018-03-23 MED ORDER — MELOXICAM 7.5 MG PO TABS: 7.5000 mg | ORAL_TABLET | Freq: Every day | ORAL | 1 refills | Status: AC

## 2018-03-23 NOTE — ED Notes (Signed)
See triage note  Presents with right shoulder pain  States she tripped and fell  Landed on right shoudler

## 2018-03-23 NOTE — ED Provider Notes (Signed)
[facility] Emergency Department Provider Note  ____________________________________________  Time seen: Approximately 7:37 PM  I have reviewed the triage vital signs and the nursing notes.   HISTORY  Chief Complaint Shoulder Pain    HPI Donna Conner is a 67 y.o. female presents to the emergency department with 6 out of 10 aching right shoulder pain after patient tripped on a crack in the concrete and fell.  Patient did not hit her head.  She denies neck pain.  She denies numbness and tingling of the right upper extremity.  Patient sustained abrasions to the lateral right elbow and forearm.  Patient denies new onset blurry vision, nausea, disorientation or confusion.  No alleviating measures have been attempted.   Past Medical History:  Diagnosis Date  . Anemia   . Arthritis    right hand  . Cancer ([place])    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  . Colon polyps   . Hyperlipidemia   . Prediabetes   . Trochanteric bursitis of both hips 07/2017   received cortisone injections bilaterally  . Trochanteric bursitis, left hip 07/2017    Patient Active Problem List   Diagnosis Date Noted  . Trochanteric bursitis of both hips 08/04/2017  . Prediabetes   . Colon polyps   . Hyperlipidemia     Past Surgical History:  Procedure Laterality Date  . CATARACT EXTRACTION W/PHACO Left 08/07/2017   Procedure: CATARACT EXTRACTION PHACO AND INTRAOCULAR LENS PLACEMENT (IOC);  Surgeon: Eulogio Bear, MD;  Location: [facility] ORS;  Service: Ophthalmology;  Laterality: Left;  Lot# [id number] H [place]:   00:50.4 AP%:   13.4 CDE:    6.78  . CATARACT EXTRACTION W/PHACO Right 08/28/2017   Procedure: CATARACT EXTRACTION PHACO AND INTRAOCULAR LENS PLACEMENT (IOC);  Surgeon: Eulogio Bear, MD;  Location: [facility] ORS;  Service: Ophthalmology;  Laterality: Right;  [place] 00:28.3 AP% 8.8 CDE 2.48 Fluid Pack lot # [id number]  . CERVICAL CONIZATION W/BX  1978  . CHOLECYSTECTOMY    .  COLONOSCOPY    . SKIN CANCER DESTRUCTION  07/29/06   basal cell  . TONSILLECTOMY AND ADENOIDECTOMY  1967    Prior to Admission medications   Medication Sig Start Date End Date Taking? Authorizing Provider  acetaminophen (TYLENOL) 500 MG tablet Take 500-1,000 mg by mouth every 6 (six) hours as needed for moderate pain or headache.     [provider]  Calcium Carbonate-Vitamin D (CALCIUM-VITAMIN D3 PO) Take 1 tablet by mouth daily.    [provider]  Cyanocobalamin (VITAMIN B 12 PO) Take by mouth.    [provider]  meloxicam (MOBIC) 7.5 MG tablet Take 1 tablet (7.5 mg total) by mouth daily for 7 days. 03/23/18 03/30/18  Lannie Fields, PA-C  predniSONE (DELTASONE) 20 MG tablet Take 3 daily for 2 days, then 2 daily for 2 days, then 1 daily for 2 days. 01/22/18   Orlena Sheldon, PA-C    Allergies Patient has no known allergies.  Family History  Problem Relation Age of Onset  . Colon cancer Neg Hx   . Esophageal cancer Neg Hx   . Rectal cancer Neg Hx   . Stomach cancer Neg Hx     Social History Social History   Tobacco Use  . Smoking status: Former Smoker    Last attempt to quit: 10/27/2003    Years since quitting: 14.4  . Smokeless tobacco: Never Used  Substance Use Topics  . Alcohol use: No  Frequency: Never  . Drug use: No     Review of Systems  Constitutional: No fever/chills Eyes: No visual changes. No discharge ENT: No upper respiratory complaints. Cardiovascular: no chest pain. Respiratory: no cough. No SOB. Gastrointestinal: No abdominal pain.  No nausea, no vomiting.  No diarrhea.  No constipation. Musculoskeletal: Patient has right shoulder pain.  Skin: Patient has abrasions along right elbow and right forearm.  Neurological: Negative for headaches, focal weakness or numbness.   ____________________________________________   PHYSICAL EXAM:  VITAL SIGNS: ED Triage Vitals  Enc Vitals Group     BP 03/23/18 1710 (!) 158/67      Pulse Rate 03/23/18 1710 60     Resp 03/23/18 1710 18     Temp 03/23/18 1710 98 F (36.7 C)     Temp Source 03/23/18 1710 Oral     SpO2 03/23/18 1710 100 %     Weight 03/23/18 1712 145 lb (65.8 kg)     Height 03/23/18 1712 5\' 2"  (1.575 m)     Head Circumference --      Peak Flow --      Pain Score 03/23/18 1711 8     Pain Loc --      Pain Edu? --      Excl. in [place]? --      Constitutional: Alert and oriented. Well appearing and in no acute distress. Eyes: Conjunctivae are normal. PERRL. EOMI. Head: Atraumatic. ENT:      Ears: TMs are pearly.      Nose: No congestion/rhinnorhea.      Mouth/Throat: Mucous membranes are moist.  Neck: No stridor.  FROM.  No midline C-spine tenderness. Cardiovascular: Normal rate, regular rhythm. Normal S1 and S2.  Good peripheral circulation. Respiratory: Normal respiratory effort without tachypnea or retractions. Lungs CTAB. Good air entry to the bases with no decreased or absent breath sounds. Musculoskeletal: Patient demonstrates full range of motion of the right shoulder.  No pain was elicited with palpation over the clavicle or the right AC joint.  No pain or weakness was elicited with right rotator cuff testing.  Palpable radial pulse, right. Neurologic:  Normal speech and language. No gross focal neurologic deficits are appreciated.  Skin:  Skin is warm, dry and intact. No rash noted. Psychiatric: Mood and affect are normal. Speech and behavior are normal. Patient exhibits appropriate insight and judgement.   ____________________________________________   LABS (all labs ordered are listed, but only abnormal results are displayed)  Labs Reviewed - No data to display ____________________________________________  EKG   ____________________________________________  RADIOLOGY I personally viewed and evaluated these images as part of my medical decision making, as well as reviewing the written report by the radiologist  Dg Shoulder  Right  Result Date: 03/23/2018 CLINICAL DATA:  Tripped and fell.  Pain. EXAM: RIGHT SHOULDER - 2+ VIEW COMPARISON:  None. FINDINGS: There is no evidence of fracture or dislocation. There is no evidence of erosive arthropathy or other focal bone abnormality. Biceps tendon calcification. No foreign body. Mild AC joint degenerative change. IMPRESSION: Negative for fracture or dislocation. Electronically Signed   By: Staci Righter M.D.   On: 03/23/2018 18:02    ____________________________________________    PROCEDURES  Procedure(s) performed:    Procedures    Medications - No data to display   ____________________________________________   INITIAL IMPRESSION / ASSESSMENT AND PLAN / ED COURSE  Pertinent labs & imaging results that were available during my care of the patient were reviewed by me and  considered in my medical decision making (see chart for details).  Review of the [place] CSRS was performed in accordance of the [place] prior to dispensing any controlled drugs.      Assessment and plan Shoulder contusion Forearm abrasion Patient presents to the emergency department with right shoulder pain after a fall that occurred earlier today.  X-ray examination reveals no acute abnormality.  Overall physical exam was reassuring.  Basic wound care was given in the emergency department for forearm abrasion.  Patient was discharged with a short course of meloxicam after she assured me that she tolerates anti-inflammatories without history of GI bleed.  Patient was referred to orthopedics.  Vital signs were reassuring prior to discharge.  All patient questions were answered.    ____________________________________________  FINAL CLINICAL IMPRESSION(S) / ED DIAGNOSES  Final diagnoses:  Contusion of right shoulder, initial encounter      NEW MEDICATIONS STARTED DURING THIS VISIT:  ED Discharge Orders         Ordered    meloxicam (MOBIC) 7.5 MG tablet  Daily     03/23/18 1854               This chart was dictated using voice recognition software/Dragon. Despite best efforts to proofread, errors can occur which can change the meaning. Any change was purely unintentional.    Lannie Fields, PA-C 03/23/18 [place], [place], MD 03/26/18 [phone]

## 2018-03-23 NOTE — ED Triage Notes (Signed)
Tripped and fell approx 2 hours ago, pain R shoulder. Patient is with husband who was with patient at the time.

## 2018-04-13 ENCOUNTER — Other Ambulatory Visit: Payer: Self-pay | Admitting: Family Medicine

## 2018-04-13 DIAGNOSIS — Z1231 Encounter for screening mammogram for malignant neoplasm of breast: Secondary | ICD-10-CM

## 2018-04-14 ENCOUNTER — Ambulatory Visit (INDEPENDENT_AMBULATORY_CARE_PROVIDER_SITE_OTHER): Payer: Medicare Other | Admitting: Orthopaedic Surgery

## 2018-04-14 ENCOUNTER — Ambulatory Visit (INDEPENDENT_AMBULATORY_CARE_PROVIDER_SITE_OTHER): Payer: Medicare Other

## 2018-04-14 DIAGNOSIS — M25521 Pain in right elbow: Secondary | ICD-10-CM

## 2018-04-14 NOTE — Progress Notes (Signed)
Office Visit Note   Patient: Donna Conner           Date of Birth: 27-Sep-1950           MRN: [id number] Visit Date: 04/14/2018              Requested by: Susy Frizzle, MD [id number] [facility] [place], [place] [address] PCP: Susy Frizzle, MD   Assessment & Plan: Visit Diagnoses:  1. Pain in right elbow     Plan: Impression is right elbow contusion.  The patient will increase her activity as tolerated.  She will take over-the-counter anti-inflammatories as needed.  Follow-up with [place] as needed.  Follow-Up Instructions: Return if symptoms worsen or fail to improve.   Orders:  Orders Placed This Encounter  Procedures  . XR Elbow Complete Right (3+View)   No orders of the defined types were placed in this encounter.     Procedures: No procedures performed   Clinical Data: No additional findings.   Subjective: Chief Complaint  Patient presents with  . Right Elbow - Pain    HPI patient is a pleasant 67 year old female who presents to our clinic today with right elbow pain.  Approximately 3 weeks ago she lost her balance while picking a weed causing her to fall on the right side of her body.  She was seen in the ED where x-rays were obtained of the shoulder as this was the most painful area.  These were negative for fracture.  Her pain has markedly improved there, but the pain to her right elbow has persisted.  No previous x-rays.  The pain in her elbow is all in the olecranon.  Pain worse when pressing down to cut something using a knife.  Occasionally, she gets a sharp shooting pain.  She has been taking meloxicam with moderate relief of symptoms.  Overall, her pain has improved.  Review of Systems as detailed in HPI.  All others reviewed and are negative.   Objective: Vital Signs: There were no vitals taken for this visit.  Physical Exam well-developed well-nourished female no acute distress.  Alert and oriented x3.  Ortho Exam examination of her right elbow  reveals full range of motion with flexion and extension as well as supination pronation.  Mild tenderness to the olecranon.  She does have a superficial abrasion over the olecranon with scab formation.  No evidence of infection.  Near full strength.  Specialty Comments:  No specialty comments available.  Imaging: Xr Elbow Complete Right (3+view)  Result Date: 04/14/2018 X-rays demonstrate small calcifications within the distal triceps tendon.  Otherwise no acute findings    PMFS History: Patient Active Problem List   Diagnosis Date Noted  . Pain in right elbow 04/14/2018  . Trochanteric bursitis of both hips 08/04/2017  . Prediabetes   . Colon polyps   . Hyperlipidemia    Past Medical History:  Diagnosis Date  . Anemia   . Arthritis    right hand  . Cancer ([place])    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  . Colon polyps   . Hyperlipidemia   . Prediabetes   . Trochanteric bursitis of both hips 07/2017   received cortisone injections bilaterally  . Trochanteric bursitis, left hip 07/2017    Family History  Problem Relation Age of Onset  . Colon cancer Neg Hx   . Esophageal cancer Neg Hx   . Rectal cancer Neg Hx   . Stomach cancer Neg  Hx     Past Surgical History:  Procedure Laterality Date  . CATARACT EXTRACTION W/PHACO Left 08/07/2017   Procedure: CATARACT EXTRACTION PHACO AND INTRAOCULAR LENS PLACEMENT (IOC);  Surgeon: Eulogio Bear, MD;  Location: [facility] ORS;  Service: Ophthalmology;  Laterality: Left;  Lot# [id number] H [place]:   00:50.4 AP%:   13.4 CDE:    6.78  . CATARACT EXTRACTION W/PHACO Right 08/28/2017   Procedure: CATARACT EXTRACTION PHACO AND INTRAOCULAR LENS PLACEMENT (IOC);  Surgeon: Eulogio Bear, MD;  Location: [facility] ORS;  Service: Ophthalmology;  Laterality: Right;  [place] 00:28.3 AP% 8.8 CDE 2.48 Fluid Pack lot # [id number]  . CERVICAL CONIZATION W/BX  1978  . CHOLECYSTECTOMY    . COLONOSCOPY    . SKIN CANCER DESTRUCTION  07/29/06   basal cell  .  TONSILLECTOMY AND ADENOIDECTOMY  1967   Social History   Occupational History  . Not on file  Tobacco Use  . Smoking status: Former Smoker    Last attempt to quit: 10/27/2003    Years since quitting: 14.4  . Smokeless tobacco: Never Used  Substance and Sexual Activity  . Alcohol use: No    Frequency: Never  . Drug use: No  . Sexual activity: Yes    Comment: married to [employer], retired

## 2018-05-25 ENCOUNTER — Ambulatory Visit
Admission: RE | Admit: 2018-05-25 | Discharge: 2018-05-25 | Disposition: A | Discharge status: Home / Self Care | Payer: Medicare Other | Source: Ambulatory Visit | Attending: Family Medicine | Admitting: Family Medicine

## 2018-05-25 DIAGNOSIS — Z1231 Encounter for screening mammogram for malignant neoplasm of breast: Secondary | ICD-10-CM

## 2018-06-23 DIAGNOSIS — Z23 Encounter for immunization: Secondary | ICD-10-CM | POA: Diagnosis not present

## 2018-07-06 ENCOUNTER — Other Ambulatory Visit: Payer: Medicare Other

## 2018-07-06 DIAGNOSIS — Z0000 Encounter for general adult medical examination without abnormal findings: Secondary | ICD-10-CM

## 2018-07-06 DIAGNOSIS — E785 Hyperlipidemia, unspecified: Secondary | ICD-10-CM | POA: Diagnosis not present

## 2018-07-06 DIAGNOSIS — R7303 Prediabetes: Secondary | ICD-10-CM | POA: Diagnosis not present

## 2018-07-07 ENCOUNTER — Other Ambulatory Visit: Payer: Medicare Other

## 2018-07-07 LAB — COMPREHENSIVE METABOLIC PANEL
AG Ratio: 1.7 (calc) (ref 1.0–2.5)
ALKALINE PHOSPHATASE (APISO): 87 U/L (ref 33–130)
ALT: 20 U/L (ref 6–29)
AST: 18 U/L (ref 10–35)
Albumin: 4.2 g/dL (ref 3.6–5.1)
BILIRUBIN TOTAL: 0.6 mg/dL (ref 0.2–1.2)
BUN: 17 mg/dL (ref 7–25)
CALCIUM: 9.6 mg/dL (ref 8.6–10.4)
CO2: 28 mmol/L (ref 20–32)
Chloride: 105 mmol/L (ref 98–110)
Creat: 0.73 mg/dL (ref 0.50–0.99)
Globulin: 2.5 g/dL (calc) (ref 1.9–3.7)
Glucose, Bld: 107 mg/dL — ABNORMAL HIGH (ref 65–99)
Potassium: 4.6 mmol/L (ref 3.5–5.3)
Sodium: 141 mmol/L (ref 135–146)
Total Protein: 6.7 g/dL (ref 6.1–8.1)

## 2018-07-07 LAB — CBC WITH DIFFERENTIAL/PLATELET
Basophils Absolute: 73 cells/uL (ref 0–200)
Basophils Relative: 1.2 %
EOS PCT: 1.7 %
Eosinophils Absolute: 104 cells/uL (ref 15–500)
HCT: 41.7 % (ref 35.0–45.0)
Hemoglobin: 14.2 g/dL (ref 11.7–15.5)
LYMPHS ABS: 2245 {cells}/uL (ref 850–3900)
MCH: 29.9 pg (ref 27.0–33.0)
MCHC: 34.1 g/dL (ref 32.0–36.0)
MCV: 87.8 fL (ref 80.0–100.0)
MPV: 9.6 fL (ref 7.5–12.5)
Monocytes Relative: 7.6 %
NEUTROS PCT: 52.7 %
Neutro Abs: 3215 cells/uL (ref 1500–7800)
PLATELETS: 340 10*3/uL (ref 140–400)
RBC: 4.75 10*6/uL (ref 3.80–5.10)
RDW: 12.1 % (ref 11.0–15.0)
TOTAL LYMPHOCYTE: 36.8 %
WBC mixed population: 464 cells/uL (ref 200–950)
WBC: 6.1 10*3/uL (ref 3.8–10.8)

## 2018-07-07 LAB — LIPID PANEL
CHOLESTEROL: 189 mg/dL (ref <200)
HDL: 65 mg/dL (ref >50)
LDL Cholesterol (Calc): 109 mg/dL (calc) — ABNORMAL HIGH
NON-HDL CHOLESTEROL (CALC): 124 mg/dL (ref <130)
TRIGLYCERIDES: 66 mg/dL (ref <150)
Total CHOL/HDL Ratio: 2.9 (calc) (ref <5.0)

## 2018-07-07 LAB — HEMOGLOBIN A1C
EAG (MMOL/L): 5.7 (calc)
Hgb A1c MFr Bld: 5.2 % of total Hgb (ref <5.7)
MEAN PLASMA GLUCOSE: 103 (calc)

## 2018-07-13 ENCOUNTER — Encounter: Payer: Self-pay | Admitting: Family Medicine

## 2018-07-13 ENCOUNTER — Ambulatory Visit (INDEPENDENT_AMBULATORY_CARE_PROVIDER_SITE_OTHER): Payer: Medicare Other | Admitting: Family Medicine

## 2018-07-13 VITALS — BP 136/84 | HR 80 | Temp 98.3°F | Resp 16 | Ht 64.5 in | Wt 156.0 lb

## 2018-07-13 DIAGNOSIS — D126 Benign neoplasm of colon, unspecified: Secondary | ICD-10-CM | POA: Diagnosis not present

## 2018-07-13 DIAGNOSIS — E785 Hyperlipidemia, unspecified: Secondary | ICD-10-CM | POA: Diagnosis not present

## 2018-07-13 DIAGNOSIS — Z0000 Encounter for general adult medical examination without abnormal findings: Secondary | ICD-10-CM

## 2018-07-13 NOTE — Progress Notes (Signed)
Subjective:    Patient ID: Donna Conner, female    DOB: [birth year], 67 y.o.   MRN: [id number]  HPI Patient is a very pleasant 67 year old Caucasian female here today for complete physical exam.  Patient's mammogram was performed in October and was normal.  She had a colonoscopy in 2016 that revealed 2 polyps.  She is not due for repeat colonoscopy until 2021.  Had a bone density test performed in January that showed mild osteopenia.  She is taking calcium but not vitamin D.  I recommend that she take 1000 mg of calcium and 1000 units of vitamin D daily.  Her last Pap smear was performed in 2017 and was normal with negative HPV DNA.  Therefore she is not due for repeat till 2022. Immunization History  Administered Date(s) Administered  . Hepatitis B 04/17/1995, 05/20/1995, 10/15/1995  . Influenza, High Dose Seasonal PF 06/12/2017  . Influenza,inj,Quad PF,6+ Mos 05/20/2013, 05/31/2014, 06/19/2015, 06/23/2018  . Influenza-Unspecified 05/15/2016  . Pneumococcal Conjugate-13 06/24/2016  . Pneumococcal Polysaccharide-23 06/19/2015  . Tdap 05/18/2011, 05/31/2014  . Zoster 06/18/2011    Her most recent lab work as listed below: Lab on 07/06/2018  Component Date Value Ref Range Status  . WBC 07/06/2018 6.1  3.8 - 10.8 Thousand/uL Final  . RBC 07/06/2018 4.75  3.80 - 5.10 Million/uL Final  . Hemoglobin 07/06/2018 14.2  11.7 - 15.5 g/dL Final  . HCT 07/06/2018 41.7  35.0 - 45.0 % Final  . MCV 07/06/2018 87.8  80.0 - 100.0 fL Final  . MCH 07/06/2018 29.9  27.0 - 33.0 pg Final  . MCHC 07/06/2018 34.1  32.0 - 36.0 g/dL Final  . RDW 07/06/2018 12.1  11.0 - 15.0 % Final  . Platelets 07/06/2018 340  140 - 400 Thousand/uL Final  . MPV 07/06/2018 9.6  7.5 - 12.5 fL Final  . Neutro Abs 07/06/2018 3,215  1,500 - 7,800 cells/uL Final  . Lymphs Abs 07/06/2018 2,245  850 - 3,900 cells/uL Final  . WBC mixed population 07/06/2018 464  200 - 950 cells/uL Final  . Eosinophils Absolute 07/06/2018 104  15 - 500  cells/uL Final  . Basophils Absolute 07/06/2018 73  0 - 200 cells/uL Final  . Neutrophils Relative % 07/06/2018 52.7  % Final  . Total Lymphocyte 07/06/2018 36.8  % Final  . Monocytes Relative 07/06/2018 7.6  % Final  . Eosinophils Relative 07/06/2018 1.7  % Final  . Basophils Relative 07/06/2018 1.2  % Final  . Glucose, Bld 07/06/2018 107* 65 - 99 mg/dL Final   Comment: .            Fasting reference interval . For someone without known diabetes, a glucose value between 100 and 125 mg/dL is consistent with prediabetes and should be confirmed with a follow-up test. .   . BUN 07/06/2018 17  7 - 25 mg/dL Final  . Creat 07/06/2018 0.73  0.50 - 0.99 mg/dL Final   Comment: For patients >50 years of age, the reference limit for Creatinine is approximately 13% higher for people identified as African-American. .   Havery Moros Ratio 30/16/0109 NOT APPLICABLE  6 - 22 (calc) Final  . Sodium 07/06/2018 141  135 - 146 mmol/L Final  . Potassium 07/06/2018 4.6  3.5 - 5.3 mmol/L Final  . Chloride 07/06/2018 105  98 - 110 mmol/L Final  . CO2 07/06/2018 28  20 - 32 mmol/L Final  . Calcium 07/06/2018 9.6  8.6 - 10.4 mg/dL Final  . Total  Protein 07/06/2018 6.7  6.1 - 8.1 g/dL Final  . Albumin 07/06/2018 4.2  3.6 - 5.1 g/dL Final  . Globulin 07/06/2018 2.5  1.9 - 3.7 g/dL (calc) Final  . AG Ratio 07/06/2018 1.7  1.0 - 2.5 (calc) Final  . Total Bilirubin 07/06/2018 0.6  0.2 - 1.2 mg/dL Final  . Alkaline phosphatase (APISO) 07/06/2018 87  33 - 130 U/L Final  . AST 07/06/2018 18  10 - 35 U/L Final  . ALT 07/06/2018 20  6 - 29 U/L Final  . Cholesterol 07/06/2018 189  <200 mg/dL Final  . HDL 07/06/2018 65  >50 mg/dL Final  . Triglycerides 07/06/2018 66  <150 mg/dL Final  . LDL Cholesterol (Calc) 07/06/2018 109* mg/dL (calc) Final   Comment: Reference range: <100 . Desirable range <100 mg/dL for primary prevention;   <70 mg/dL for patients with CHD or diabetic patients  with > or = 2 CHD risk  factors. Marland Kitchen LDL-C is now calculated using the Martin-Hopkins  calculation, which is a validated novel method providing  better accuracy than the Friedewald equation in the  estimation of LDL-C.  Cresenciano Genre et al. Annamaria Helling. 8341;962(22): 2061-2068  (http://education.[employer].com/faq/FAQ164)   . Total CHOL/HDL Ratio 07/06/2018 2.9  <5.0 (calc) Final  . Non-HDL Cholesterol (Calc) 07/06/2018 124  <130 mg/dL (calc) Final   Comment: For patients with diabetes plus 1 major ASCVD risk  factor, treating to a non-HDL-C goal of <100 mg/dL  (LDL-C of <70 mg/dL) is considered a therapeutic  option.   . Hgb A1c MFr Bld 07/06/2018 5.2  <5.7 % of total Hgb Final   Comment: For the purpose of screening for the presence of diabetes: . <5.7%       Consistent with the absence of diabetes 5.7-6.4%    Consistent with increased risk for diabetes             (prediabetes) > or =6.5%  Consistent with diabetes . This assay result is consistent with a decreased risk of diabetes. . Currently, no consensus exists regarding use of hemoglobin A1c for diagnosis of diabetes in children. . According to American Diabetes Association (ADA) guidelines, hemoglobin A1c <7.0% represents optimal control in non-pregnant diabetic patients. Different metrics may apply to specific patient populations.  Standards of Medical Care in Diabetes(ADA). .   . Mean Plasma Glucose 07/06/2018 103  (calc) Final  . eAG (mmol/L) 07/06/2018 5.7  (calc) Final   Past Medical History:  Diagnosis Date  . Anemia   . Arthritis    right hand  . Cancer ([place])    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  . Colon polyps   . Hyperlipidemia   . Prediabetes   . Trochanteric bursitis of both hips 07/2017   received cortisone injections bilaterally  . Trochanteric bursitis, left hip 07/2017   Past Surgical History:  Procedure Laterality Date  . CATARACT EXTRACTION W/PHACO Left 08/07/2017   Procedure: CATARACT EXTRACTION PHACO AND  INTRAOCULAR LENS PLACEMENT (IOC);  Surgeon: Eulogio Bear, MD;  Location: [facility] ORS;  Service: Ophthalmology;  Laterality: Left;  Lot# [id number] H [place]:   00:50.4 AP%:   13.4 CDE:    6.78  . CATARACT EXTRACTION W/PHACO Right 08/28/2017   Procedure: CATARACT EXTRACTION PHACO AND INTRAOCULAR LENS PLACEMENT (IOC);  Surgeon: Eulogio Bear, MD;  Location: [facility] ORS;  Service: Ophthalmology;  Laterality: Right;  [place] 00:28.3 AP% 8.8 CDE 2.48 Fluid Pack lot # [id number]  . CERVICAL CONIZATION W/BX  1978  . CHOLECYSTECTOMY    .  COLONOSCOPY    . SKIN CANCER DESTRUCTION  07/29/06   basal cell  . TONSILLECTOMY AND ADENOIDECTOMY  1967   Current Outpatient Medications on File Prior to Visit  Medication Sig Dispense Refill  . acetaminophen (TYLENOL) 500 MG tablet Take 500-1,000 mg by mouth every 6 (six) hours as needed for moderate pain or headache.     . Calcium Carbonate-Vitamin D (CALCIUM-VITAMIN D3 PO) Take 1 tablet by mouth daily.    . Cyanocobalamin (VITAMIN B 12 PO) Take by mouth.     No current facility-administered medications on file prior to visit.    No Known Allergies Social History   Socioeconomic History  . Marital status: Married    Spouse name: Not on file  . Number of children: Not on file  . Years of education: Not on file  . Highest education level: Not on file  Occupational History  . Not on file  Social Needs  . Financial resource strain: Not on file  . Food insecurity:    Worry: Not on file    Inability: Not on file  . Transportation needs:    Medical: Not on file    Non-medical: Not on file  Tobacco Use  . Smoking status: Former Smoker    Last attempt to quit: 10/27/2003    Years since quitting: 14.7  . Smokeless tobacco: Never Used  Substance and Sexual Activity  . Alcohol use: No    Frequency: Never  . Drug use: No  . Sexual activity: Yes    Comment: married to Jenny Reichmann, retired  Lifestyle  . Physical activity:    Days per week: Not on file    Minutes per  session: Not on file  . Stress: Not on file  Relationships  . Social connections:    Talks on phone: Not on file    Gets together: Not on file    Attends religious service: Not on file    Active member of club or organization: Not on file    Attends meetings of clubs or organizations: Not on file    Relationship status: Not on file  . Intimate partner violence:    Fear of current or ex partner: Not on file    Emotionally abused: Not on file    Physically abused: Not on file    Forced sexual activity: Not on file  Other Topics Concern  . Not on file  Social History Narrative  . Not on file   Family History  Problem Relation Age of Onset  . Colon cancer Neg Hx   . Esophageal cancer Neg Hx   . Rectal cancer Neg Hx   . Stomach cancer Neg Hx     Review of Systems  All other systems reviewed and are negative.      Objective:   Physical Exam  Constitutional: She is oriented to person, place, and time. She appears well-developed and well-nourished. No distress.  HENT:  Head: Normocephalic and atraumatic.  Right Ear: External ear normal.  Left Ear: External ear normal.  Nose: Nose normal.  Mouth/Throat: Oropharynx is clear and moist. No oropharyngeal exudate.  Eyes: Pupils are equal, round, and reactive to light. Conjunctivae and EOM are normal. Right eye exhibits no discharge. Left eye exhibits no discharge. No scleral icterus.  Neck: Normal range of motion. Neck supple. No JVD present. No tracheal deviation present. No thyromegaly present.  Cardiovascular: Normal rate, regular rhythm, normal heart sounds and intact distal pulses. Exam reveals no gallop and no  friction rub.  No murmur heard. Pulmonary/Chest: Effort normal and breath sounds normal. No stridor. No respiratory distress. She has no wheezes. She has no rales. She exhibits no tenderness.  Abdominal: Soft. Bowel sounds are normal. She exhibits no distension and no mass. There is no abdominal tenderness. There is no  rebound and no guarding.  Musculoskeletal: Normal range of motion.        General: No tenderness or edema.  Lymphadenopathy:    She has no cervical adenopathy.  Neurological: She is alert and oriented to person, place, and time. She has normal reflexes. No cranial nerve deficit. She exhibits normal muscle tone. Coordination normal.  Skin: Skin is warm. No rash noted. She is not diaphoretic. No erythema. No pallor.  Psychiatric: She has a normal mood and affect. Her behavior is normal. Judgment and thought content normal.  Vitals reviewed.         Assessment & Plan:  Normal medical exam  Physical exam today is completely normal aside from a mildly elevated fasting blood sugar.  Patient's immunization and cancer screening is up-to-date.  I recommended the patient add vitamin D.  I would repeat a bone density test in 2 years.  I recommended a mammogram next year and a colonoscopy in 2021.  Patient does not require Pap smears any longer.  Did recommend limiting her intake of carbohydrates due to her elevated fasting blood sugar.  Otherwise continue with no changes.

## 2018-08-23 ENCOUNTER — Encounter: Payer: Self-pay | Admitting: Family Medicine

## 2018-09-02 ENCOUNTER — Ambulatory Visit (INDEPENDENT_AMBULATORY_CARE_PROVIDER_SITE_OTHER): Payer: Medicare Other | Admitting: Orthopaedic Surgery

## 2018-09-09 ENCOUNTER — Ambulatory Visit (INDEPENDENT_AMBULATORY_CARE_PROVIDER_SITE_OTHER): Payer: Medicare Other

## 2018-09-09 ENCOUNTER — Ambulatory Visit (INDEPENDENT_AMBULATORY_CARE_PROVIDER_SITE_OTHER): Payer: Medicare Other | Admitting: Orthopaedic Surgery

## 2018-09-09 DIAGNOSIS — M542 Cervicalgia: Secondary | ICD-10-CM

## 2018-09-09 DIAGNOSIS — M7061 Trochanteric bursitis, right hip: Secondary | ICD-10-CM

## 2018-09-09 DIAGNOSIS — M7062 Trochanteric bursitis, left hip: Secondary | ICD-10-CM

## 2018-09-09 MED ORDER — BUPIVACAINE HCL 0.5 % IJ SOLN
3.0000 mL | INTRAMUSCULAR | Status: AC | PRN
  Administered 2018-09-09: 3 mL via INTRA_ARTICULAR

## 2018-09-09 MED ORDER — LIDOCAINE HCL 1 % IJ SOLN
3.0000 mL | INTRAMUSCULAR | Status: AC | PRN
  Administered 2018-09-09: 3 mL

## 2018-09-09 MED ORDER — PREDNISONE 10 MG (21) PO TBPK: ORAL_TABLET | ORAL | 0 refills | Status: DC

## 2018-09-09 MED ORDER — METHYLPREDNISOLONE ACETATE 40 MG/ML IJ SUSP
40.0000 mg | INTRAMUSCULAR | Status: AC | PRN
  Administered 2018-09-09: 40 mg via INTRA_ARTICULAR

## 2018-09-09 MED ORDER — CYCLOBENZAPRINE HCL 10 MG PO TABS: 10.0000 mg | ORAL_TABLET | Freq: Two times a day (BID) | ORAL | 0 refills | Status: DC | PRN

## 2018-09-09 NOTE — Progress Notes (Signed)
Office Visit Note   Patient: Donna Conner           Date of Birth: 1951/06/25           MRN: [id number] Visit Date: 09/09/2018              Requested by: Susy Frizzle, MD [address] [facility] [place], [place] [address] PCP: Susy Frizzle, MD   Assessment & Plan: Visit Diagnoses:  1. Trochanteric bursitis of both hips   2. Cervicalgia     Plan: Impression is cervical spondylosis and left scapular trigger point as well as bilateral trochanteric bursitis.  I taught her exercises to help with the trigger point.  Prescription for Flexeril and prednisone Dosepak.  Bilateral trochanteric bursa injections performed today.  Follow-up as needed.  Follow-Up Instructions: Return if symptoms worsen or fail to improve.   Orders:  Orders Placed This Encounter  Procedures  . XR Cervical Spine 2 or 3 views  . XR HIPS BILAT W OR W/O PELVIS 3-4 VIEWS   Meds ordered this encounter  Medications  . predniSONE (STERAPRED UNI-PAK 21 TAB) 10 MG (21) TBPK tablet    Sig: Take as directed    Dispense:  21 tablet    Refill:  0  . cyclobenzaprine (FLEXERIL) 10 MG tablet    Sig: Take 1 tablet (10 mg total) by mouth 2 (two) times daily as needed for muscle spasms.    Dispense:  30 tablet    Refill:  0      Procedures: Large Joint Inj: bilateral greater trochanter on 09/09/2018 3:07 PM Indications: pain Details: 22 G needle Medications (Right): 3 mL lidocaine 1 %; 3 mL bupivacaine 0.5 %; 40 mg methylPREDNISolone acetate 40 MG/ML Medications (Left): 3 mL lidocaine 1 %; 3 mL bupivacaine 0.5 %; 40 mg methylPREDNISolone acetate 40 MG/ML Outcome: tolerated well, no immediate complications Patient was prepped and draped in the usual sterile fashion.       Clinical Data: No additional findings.   Subjective: Chief Complaint  Patient presents with  . Neck - Pain    Donna Conner is a 68 year old female comes in with recurrent bilateral trochanteric bursitis as well as neck pain without  radicular symptoms.  She had a really good relief from previous trochanteric injections about a year ago and would like to have another one.  Denies any groin pain.  In terms of her neck she does state that she has pain that radiates down the muscular fashion down to the left periscapular trigger area.    Review of Systems  Constitutional: Negative.   HENT: Negative.   Eyes: Negative.   Respiratory: Negative.   Cardiovascular: Negative.   Endocrine: Negative.   Musculoskeletal: Negative.   Neurological: Negative.   Hematological: Negative.   Psychiatric/Behavioral: Negative.   All other systems reviewed and are negative.    Objective: Vital Signs: There were no vitals taken for this visit.  Physical Exam Vitals signs and nursing note reviewed.  Constitutional:      Appearance: She is well-developed.  HENT:     Head: Normocephalic and atraumatic.  Neck:     Musculoskeletal: Neck supple.  Pulmonary:     Effort: Pulmonary effort is normal.  Abdominal:     Palpations: Abdomen is soft.  Skin:    General: Skin is warm.     Capillary Refill: Capillary refill takes less than 2 seconds.  Neurological:     Mental Status: She is alert and oriented to  person, place, and time.  Psychiatric:        Behavior: Behavior normal.        Thought Content: Thought content normal.        Judgment: Judgment normal.     Ortho Exam Cervical spine exam is nonfocal.  Negative Spurling. Bilateral hip exam shows tenderness of the trochanteric bursa's. Specialty Comments:  No specialty comments available.  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  No acute abnormalities.   Xr Cervical Spine 2 Or 3 Views  Result Date: 09/09/2018 Multilevel degenerative disc disease with cervical spondylosis.    PMFS History: Patient Active Problem List   Diagnosis Date Noted  . Pain in right elbow 04/14/2018  .  Trochanteric bursitis of both hips 08/04/2017  . Prediabetes   . Colon polyps   . Hyperlipidemia    Past Medical History:  Diagnosis Date  . Anemia   . Arthritis    right hand  . Cancer ([place])    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  . Colon polyps   . Hyperlipidemia   . Prediabetes   . Trochanteric bursitis of both hips 07/2017   received cortisone injections bilaterally  . Trochanteric bursitis, left hip 07/2017    Family History  Problem Relation Age of Onset  . Colon cancer Neg Hx   . Esophageal cancer Neg Hx   . Rectal cancer Neg Hx   . Stomach cancer Neg Hx     Past Surgical History:  Procedure Laterality Date  . CATARACT EXTRACTION W/PHACO Left 08/07/2017   Procedure: CATARACT EXTRACTION PHACO AND INTRAOCULAR LENS PLACEMENT (IOC);  Surgeon: Eulogio Bear, MD;  Location: [facility] ORS;  Service: Ophthalmology;  Laterality: Left;  Lot# [id number] [place]:   00:50.4 AP%:   13.4 CDE:    6.78  . CATARACT EXTRACTION W/PHACO Right 08/28/2017   Procedure: CATARACT EXTRACTION PHACO AND INTRAOCULAR LENS PLACEMENT (IOC);  Surgeon: Eulogio Bear, MD;  Location: [facility] ORS;  Service: Ophthalmology;  Laterality: Right;  [place] 00:28.3 AP% 8.8 CDE 2.48 Fluid Pack lot # [id number]  . CERVICAL CONIZATION W/BX  1978  . CHOLECYSTECTOMY    . COLONOSCOPY    . SKIN CANCER DESTRUCTION  07/29/06   basal cell  . TONSILLECTOMY AND ADENOIDECTOMY  1967   Social History   Occupational History  . Not on file  Tobacco Use  . Smoking status: Former Smoker    Last attempt to quit: 10/27/2003    Years since quitting: 14.8  . Smokeless tobacco: Never Used  Substance and Sexual Activity  . Alcohol use: No    Frequency: Never  . Drug use: No  . Sexual activity: Yes    Comment: married to [place], retired

## 2018-12-10 DIAGNOSIS — Z961 Presence of intraocular lens: Secondary | ICD-10-CM | POA: Diagnosis not present

## 2019-03-22 ENCOUNTER — Telehealth: Payer: Medicare Other | Admitting: Physician Assistant

## 2019-03-22 ENCOUNTER — Encounter: Payer: Self-pay | Admitting: Physician Assistant

## 2019-03-22 DIAGNOSIS — L237 Allergic contact dermatitis due to plants, except food: Secondary | ICD-10-CM

## 2019-03-22 MED ORDER — PREDNISONE 10 MG PO TABS: 10.0000 mg | ORAL_TABLET | Freq: Every day | ORAL | 0 refills | Status: DC

## 2019-03-22 NOTE — Progress Notes (Signed)

## 2019-04-03 DIAGNOSIS — L255 Unspecified contact dermatitis due to plants, except food: Secondary | ICD-10-CM | POA: Diagnosis not present

## 2019-04-27 ENCOUNTER — Other Ambulatory Visit: Payer: Self-pay | Admitting: Family Medicine

## 2019-04-27 DIAGNOSIS — Z1231 Encounter for screening mammogram for malignant neoplasm of breast: Secondary | ICD-10-CM

## 2019-04-27 DIAGNOSIS — L720 Epidermal cyst: Secondary | ICD-10-CM | POA: Diagnosis not present

## 2019-04-27 DIAGNOSIS — L570 Actinic keratosis: Secondary | ICD-10-CM | POA: Diagnosis not present

## 2019-04-27 DIAGNOSIS — D485 Neoplasm of uncertain behavior of skin: Secondary | ICD-10-CM | POA: Diagnosis not present

## 2019-04-28 DIAGNOSIS — Z23 Encounter for immunization: Secondary | ICD-10-CM | POA: Diagnosis not present

## 2019-06-10 ENCOUNTER — Other Ambulatory Visit: Payer: Self-pay

## 2019-06-10 ENCOUNTER — Ambulatory Visit
Admission: RE | Admit: 2019-06-10 | Discharge: 2019-06-10 | Disposition: A | Discharge status: Home / Self Care | Payer: Medicare Other | Source: Ambulatory Visit | Attending: Family Medicine | Admitting: Family Medicine

## 2019-06-10 DIAGNOSIS — Z1231 Encounter for screening mammogram for malignant neoplasm of breast: Secondary | ICD-10-CM

## 2019-10-18 ENCOUNTER — Encounter: Payer: Self-pay | Admitting: Gastroenterology

## 2019-11-10 ENCOUNTER — Ambulatory Visit (AMBULATORY_SURGERY_CENTER): Payer: Self-pay

## 2019-11-10 ENCOUNTER — Other Ambulatory Visit: Payer: Self-pay

## 2019-11-10 DIAGNOSIS — Z86010 Personal history of colonic polyps: Secondary | ICD-10-CM

## 2019-11-10 NOTE — Progress Notes (Signed)
Patient's pre-visit was done today over the phone with the patient due to COVID-19 pandemic.   Name,DOB and address verified. Insurance verified. Packet of Prep instructions mailed to patient including copy of a consent form and pre-procedure patient acknowledgement form-pt is aware.  Patient understands to call us back with any questions or concerns.  Pt is aware that care partner will wait in the car during procedure; if they feel like they will be too hot or cold to wait in the car; they may wait in the 4 th floor lobby. Patient is aware to bring only one care partner. We want them to wear a mask (we do not have any that we can provide them), practice social distancing, and we will check their temperatures when they get here.  I did remind the patient that their care partner needs to stay in the parking lot the entire time and have a cell phone available, we will call them when the pt is ready for discharge. Patient will wear mask into building.

## 2019-11-16 ENCOUNTER — Other Ambulatory Visit: Payer: Self-pay

## 2019-11-16 ENCOUNTER — Ambulatory Visit (INDEPENDENT_AMBULATORY_CARE_PROVIDER_SITE_OTHER): Payer: Medicare Other | Admitting: Orthopaedic Surgery

## 2019-11-16 ENCOUNTER — Encounter: Payer: Self-pay | Admitting: Orthopaedic Surgery

## 2019-11-16 ENCOUNTER — Ambulatory Visit (INDEPENDENT_AMBULATORY_CARE_PROVIDER_SITE_OTHER): Payer: Medicare Other

## 2019-11-16 DIAGNOSIS — G8929 Other chronic pain: Secondary | ICD-10-CM | POA: Diagnosis not present

## 2019-11-16 DIAGNOSIS — M7062 Trochanteric bursitis, left hip: Secondary | ICD-10-CM

## 2019-11-16 DIAGNOSIS — M7061 Trochanteric bursitis, right hip: Secondary | ICD-10-CM

## 2019-11-16 DIAGNOSIS — M545 Low back pain, unspecified: Secondary | ICD-10-CM

## 2019-11-16 MED ORDER — METHYLPREDNISOLONE ACETATE 40 MG/ML IJ SUSP
40.0000 mg | INTRAMUSCULAR | Status: AC | PRN
  Administered 2019-11-16: 40 mg via INTRA_ARTICULAR

## 2019-11-16 MED ORDER — LIDOCAINE HCL 1 % IJ SOLN
0.5000 mL | INTRAMUSCULAR | Status: AC | PRN
  Administered 2019-11-16: .5 mL

## 2019-11-16 MED ORDER — BUPIVACAINE HCL 0.25 % IJ SOLN
0.6600 mL | INTRAMUSCULAR | Status: AC | PRN
  Administered 2019-11-16: .66 mL via INTRA_ARTICULAR

## 2019-11-16 NOTE — Progress Notes (Signed)
Office Visit Note   Patient: Donna Conner           Date of Birth: 12-12-1950           MRN: [id number] Visit Date: 11/16/2019              Requested by: Susy Frizzle, MD [address] [facility] [place],  [place] [address] PCP: Susy Frizzle, MD   Assessment & Plan: Visit Diagnoses:  1. Greater trochanteric bursitis of both hips   2. Chronic low back pain, unspecified back pain laterality, unspecified whether sciatica present     Plan: Impression is bilateral hip trochanteric bursitis with underlying bilateral lumbar radiculopathy.  I believe the majority of patient symptoms are coming from her bursitis.  Due to the fact that previous injection significantly helped, we will repeat them today.  I will also provide her with an iliotibial band exercise program.  She will follow up with [place] as needed.  Follow-Up Instructions: Return if symptoms worsen or fail to improve.   Orders:  Orders Placed This Encounter  Procedures  . Large Joint Inj: bilateral greater trochanter  . XR HIPS BILAT W OR W/O PELVIS 3-4 VIEWS  . XR Lumbar Spine 2-3 Views   No orders of the defined types were placed in this encounter.     Procedures: Large Joint Inj: bilateral greater trochanter on 11/16/2019 3:07 PM Indications: pain Details: 22 G needle, lateral approach Medications (Right): 0.5 mL lidocaine 1 %; 0.66 mL bupivacaine 0.25 %; 40 mg methylPREDNISolone acetate 40 MG/ML Medications (Left): 0.5 mL lidocaine 1 %; 0.66 mL bupivacaine 0.25 %; 40 mg methylPREDNISolone acetate 40 MG/ML      Clinical Data: No additional findings.   Subjective: Chief Complaint  Patient presents with  . Right Hip - Pain  . Left Hip - Pain  . Lower Back - Pain    HPI patient is a pleasant 69-year-old female who comes in today with bilateral lateral hip pain.  She was seen by [place] in February 2020 for the same issue.  Both trochanteric bursa's were injected with cortisone.  These provided significant relief  for about 4 to 5 months.  Her pain has returned and is started to worsen.  The pain is to the lateral aspect of both hips left greater than right.  Her symptoms are worse during exercises at [facility] which include hip abduction and repetitive forward flexion.  She also has pain sleeping on either side.  She has tried Tylenol without relief of symptoms.  She has recently noticed burning that occasionally occurs down the entire aspect of both legs.  No previous lumbar pathology.  No anterior thigh or groin pain.  Review of Systems as detailed in HPI.  All others reviewed and are negative.   Objective: Vital Signs: There were no vitals taken for this visit.  Physical Exam well-developed well-nourished female no acute distress.  Alert and oriented x3.  Ortho Exam examination of both hips reveals significant tenderness the greater trochanter.  Negative logroll Abugo FADIR.  Negative straight leg raise both sides.  She is neurovascular intact distally.  Specialty Comments:  No specialty comments available.  Imaging: XR HIPS BILAT W OR W/O PELVIS 3-4 VIEWS  Result Date: 11/16/2019 X-rays demonstrate moderate bilateral hip degenerative changes  XR Lumbar Spine 2-3 Views  Result Date: 11/16/2019 Mild diffuse spondylosis    PMFS History: Patient Active Problem List   Diagnosis Date Noted  . Pain in right elbow  04/14/2018  . Trochanteric bursitis of both hips 08/04/2017  . Prediabetes   . Colon polyps   . Hyperlipidemia    Past Medical History:  Diagnosis Date  . Anemia   . Arthritis    right hand  . 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 Cataract    bilateral repair  . Colon polyps   . Hyperlipidemia   . Prediabetes   . Trochanteric bursitis of both hips 07/2017   received cortisone injections bilaterally  . Trochanteric bursitis, left hip 07/2017    Family History  Problem Relation Age of Onset  . Colon cancer Neg Hx   . Esophageal cancer Neg Hx   .  Rectal cancer Neg Hx   . Stomach cancer Neg Hx   . Colon polyps Neg Hx     Past Surgical History:  Procedure Laterality Date  . CATARACT EXTRACTION W/PHACO Left 08/07/2017   Procedure: CATARACT EXTRACTION PHACO AND INTRAOCULAR LENS PLACEMENT (IOC);  Surgeon: Eulogio Bear, MD;  Location: [facility] ORS;  Service: Ophthalmology;  Laterality: Left;  Lot# [id number] H [place]:   00:50.4 AP%:   13.4 CDE:    6.78  . CATARACT EXTRACTION W/PHACO Right 08/28/2017   Procedure: CATARACT EXTRACTION PHACO AND INTRAOCULAR LENS PLACEMENT (IOC);  Surgeon: Eulogio Bear, MD;  Location: [facility] ORS;  Service: Ophthalmology;  Laterality: Right;  [place] 00:28.3 AP% 8.8 CDE 2.48 Fluid Pack lot # [id number]  . CERVICAL CONIZATION W/BX  1978  . CHOLECYSTECTOMY    . COLONOSCOPY  2016  . SKIN CANCER DESTRUCTION  07/29/06   basal cell  . TONSILLECTOMY AND ADENOIDECTOMY  1967   Social History   Occupational History  . Not on file  Tobacco Use  . Smoking status: Former Smoker    Quit date: 10/27/2003    Years since quitting: 16.0  . Smokeless tobacco: Never Used  Substance and Sexual Activity  . Alcohol use: No  . Drug use: No  . Sexual activity: Yes    Comment: married to [place], retired

## 2019-11-23 ENCOUNTER — Telehealth: Payer: Self-pay | Admitting: Internal Medicine

## 2019-11-23 NOTE — Telephone Encounter (Signed)
Thank you :)

## 2019-11-23 NOTE — Telephone Encounter (Signed)
Patient for surveillance colonoscopy tomorrow. Called complaining of vomiting a few hours after completing first half of Miralax prep. Didn't care for sweet taste. No problems prepping in past. Feeling better now . No other complaints. Discussed. Told to drink second session slower and suck on hard candy that she likes.

## 2019-11-24 ENCOUNTER — Other Ambulatory Visit: Payer: Self-pay

## 2019-11-24 ENCOUNTER — Ambulatory Visit (AMBULATORY_SURGERY_CENTER): Payer: Medicare Other | Admitting: Gastroenterology

## 2019-11-24 ENCOUNTER — Encounter: Payer: Self-pay | Admitting: Gastroenterology

## 2019-11-24 VITALS — BP 143/72 | HR 58 | Temp 96.8°F | Resp 16 | Ht 64.0 in | Wt 155.0 lb

## 2019-11-24 DIAGNOSIS — D122 Benign neoplasm of ascending colon: Secondary | ICD-10-CM | POA: Diagnosis not present

## 2019-11-24 DIAGNOSIS — Z86010 Personal history of colonic polyps: Secondary | ICD-10-CM

## 2019-11-24 DIAGNOSIS — D123 Benign neoplasm of transverse colon: Secondary | ICD-10-CM

## 2019-11-24 DIAGNOSIS — K635 Polyp of colon: Secondary | ICD-10-CM

## 2019-11-24 MED ORDER — SODIUM CHLORIDE 0.9 % IV SOLN: 500.0000 mL | Freq: Once | INTRAVENOUS | Status: DC

## 2019-11-24 NOTE — Progress Notes (Signed)
Called to room to assist during endoscopic procedure.  Patient ID and intended procedure confirmed with present staff. Received instructions for my participation in the procedure from the performing physician.  

## 2019-11-24 NOTE — Progress Notes (Addendum)
Temp check by:JB Vital check by:DT  The patient states no changes in medical or surgical history since pre-visit screening on 11/10/2019.

## 2019-11-24 NOTE — Progress Notes (Signed)
To [facility], VSS. Report to [email or web address]

## 2019-11-24 NOTE — Progress Notes (Signed)
No problems noted in the recovery room. maw 

## 2019-11-24 NOTE — Patient Instructions (Addendum)
YOU HAD AN ENDOSCOPIC PROCEDURE TODAY AT [facility]:   Refer to the procedure report that was given to you for any specific questions about what was found during the examination.  If the procedure report does not answer your questions, please call your gastroenterologist to clarify.  If you requested that your care partner not be given the details of your procedure findings, then the procedure report has been included in a sealed envelope for you to review at your convenience later.  YOU SHOULD EXPECT: Some feelings of bloating in the abdomen. Passage of more gas than usual.  Walking can help get rid of the air that was put into your GI tract during the procedure and reduce the bloating. If you had a lower endoscopy (such as a colonoscopy or flexible sigmoidoscopy) you may notice spotting of blood in your stool or on the toilet paper. If you underwent a bowel prep for your procedure, you may not have a normal bowel movement for a few days.  Please Note:  You might notice some irritation and congestion in your nose or some drainage.  This is from the oxygen used during your procedure.  There is no need for concern and it should clear up in a day or so.  SYMPTOMS TO REPORT IMMEDIATELY:   Following lower endoscopy (colonoscopy or flexible sigmoidoscopy):  Excessive amounts of blood in the stool  Significant tenderness or worsening of abdominal pains  Swelling of the abdomen that is new, acute  Fever of 100F or higher  For urgent or emergent issues, a gastroenterologist can be reached at any hour by calling [phone]. Do not use MyChart messaging for urgent concerns.    DIET:  We do recommend a small meal at first, but then you may proceed to your regular diet.  Drink plenty of fluids but you should avoid alcoholic beverages for 24 hours.  ACTIVITY:  You should plan to take it easy for the rest of today and you should NOT DRIVE or use heavy machinery until tomorrow (because  of the sedation medicines used during the test).    FOLLOW UP: Our staff will call the number listed on your records 48-72 hours following your procedure to check on you and address any questions or concerns that you may have regarding the information given to you following your procedure. If we do not reach you, we will leave a message.  We will attempt to reach you two times.  During this call, we will ask if you have developed any symptoms of COVID 19. If you develop any symptoms (ie: fever, flu-like symptoms, shortness of breath, cough etc.) before then, please call [phone].  If you test positive for Covid 19 in the 2 weeks post procedure, please call and report this information to [place].    If any biopsies were taken you will be contacted by phone or by letter within the next 1-3 weeks.  Please call us at [phone] if you have not heard about the biopsies in 3 weeks.    SIGNATURES/CONFIDENTIALITY: You and/or your care partner have signed paperwork which will be entered into your electronic medical record.  These signatures attest to the fact that that the information above on your After Visit Summary has been reviewed and is understood.  Full responsibility of the confidentiality of this discharge information lies with you and/or your care-partner.    Handouts were given to you on polyps, diverticulosis, hemorrhoids, and a high fiber diet with liberal  fluid intake. Emerging evidence supports eating a diet of fruits, vegetables, grains, calcium, and yogurt while reducing red meat and alcohol may reduce the risk of colon cancer.  You may resume your current medications today. Await biopsy results. Please call if any questions or concerns.

## 2019-11-24 NOTE — Op Note (Signed)
[facility] Patient Name: Donna Conner Procedure Date: 11/24/2019 7:19 AM MRN: [id number] Endoscopist: Thornton Park MD, MD Age: 69 Referring MD:  Date of Birth: 1951-01-12 Gender: Female Account #: [id number] Procedure:                Colonoscopy Indications:              Surveillance: Personal history of adenomatous                            polyps on last colonoscopy 5 years ago                           Colonoscopy 2013: 3 tubular adenomas, 1 serrated                            adenoma                           Colonoscopy 2016: 2 tubular adenomas                           No known family history of colon cancer or polyps Medicines:                Monitored Anesthesia Care Procedure:                Pre-Anesthesia Assessment:                           - Prior to the procedure, a History and Physical                            was performed, and patient medications and                            allergies were reviewed. The patient's tolerance of                            previous anesthesia was also reviewed. The risks                            and benefits of the procedure and the sedation                            options and risks were discussed with the patient.                            All questions were answered, and informed consent                            was obtained. Prior Anticoagulants: The patient has                            taken no previous anticoagulant or antiplatelet  agents. ASA Grade Assessment: II - A patient with                            mild systemic disease. After reviewing the risks                            and benefits, the patient was deemed in                            satisfactory condition to undergo the procedure.                           After obtaining informed consent, the colonoscope                            was passed under direct vision. Throughout the   procedure, the patient's blood pressure, pulse, and                            oxygen saturations were monitored continuously. The                            Colonoscope was introduced through the anus and                            advanced to the 4 cm into the ileum. A second                            forward view of the right colon was performed. The                            colonoscopy was performed with moderate difficulty                            due to a redundant colon, significant looping and a                            tortuous colon. Successful completion of the                            procedure was aided by applying abdominal pressure.                            The patient tolerated the procedure well. The                            quality of the bowel preparation was good. Over                            1020mL of liquid was removed from the colon during                            the procedure. The terminal ileum, ileocecal valve,  appendiceal orifice, and rectum were photographed. Scope In: 8:03:19 AM Scope Out: 8:23:39 AM Scope Withdrawal Time: 0 hours 15 minutes 48 seconds  Total Procedure Duration: 0 hours 20 minutes 20 seconds  Findings:                 The perianal and digital rectal examinations were                            normal.                           Non-bleeding internal hemorrhoids were found. The                            hemorrhoids were small.                           Multiple small and large-mouthed diverticula were                            found in the sigmoid colon.                           Four sessile polyps were found in the descending                            colon, hepatic flexure and ascending colon. The                            polyps were 2 to 3 mm in size. These polyps were                            removed with a cold snare. Resection and retrieval                            were complete.                            The exam was otherwise without abnormality on                            direct and retroflexion views. Complications:            No immediate complications. Estimated blood loss:                            Minimal. Estimated Blood Loss:     Estimated blood loss was minimal. Impression:               - Non-bleeding internal hemorrhoids.                           - Diverticulosis in the sigmoid colon.                           - Four 2 to 3 mm polyps in the descending colon, at  the hepatic flexure and in the ascending colon,                            removed with a cold snare. Resected and retrieved.                           - The examination was otherwise normal on direct                            and retroflexion views. Recommendation:           - Patient has a contact number available for                            emergencies. The signs and symptoms of potential                            delayed complications were discussed with the                            patient. Return to normal activities tomorrow.                            Written discharge instructions were provided to the                            patient.                           - Resume previous diet. High fiber diet recommended.                           - Continue present medications.                           - Await pathology results.                           - Repeat colonoscopy date to be determined after                            pending pathology results are reviewed for                            surveillance.                           - Emerging evidence supports eating a diet of                            fruits, vegetables, grains, calcium, and yogurt                            while reducing red meat and alcohol may reduce the  risk of colon cancer.                           - Thank you for allowing me to be involved in your                             colon cancer prevention. Thornton Park MD, MD 11/24/2019 8:31:18 AM This report has been signed electronically.

## 2019-11-26 ENCOUNTER — Telehealth: Payer: Self-pay

## 2019-11-26 NOTE — Telephone Encounter (Signed)
  Follow up Call-  Call back number 11/24/2019  Post procedure Call Back phone  # [phone]  Permission to leave phone message Yes  Some recent data might be hidden     Patient questions:  Do you have a fever, pain , or abdominal swelling? Yes.   Pain Score  2 * Pt still having some abdominal discomfort, states that it is much better today than it has been.   Have you tolerated food without any problems? Yes.    Have you been able to return to your normal activities? Yes.    Do you have any questions about your discharge instructions: Diet   No. Medications  No. Follow up visit  No.  Do you have questions or concerns about your Care? No.  Actions: * If pain score is 4 or above: No action needed, pain <4.   Have you developed a fever since your procedure? No  2.   Have you had an respiratory symptoms (SOB or cough) since your procedure? No  3.   Have you tested positive for COVID 19 since your procedure No  4.   Have you had any family members/close contacts diagnosed with the COVID 19 since your procedure?  No   If yes to any of these questions please route to Joylene John, RN and Erenest Rasher, RN

## 2019-11-29 ENCOUNTER — Encounter: Payer: Self-pay | Admitting: Gastroenterology

## 2019-11-30 DIAGNOSIS — H61001 Unspecified perichondritis of right external ear: Secondary | ICD-10-CM | POA: Diagnosis not present

## 2020-01-05 DIAGNOSIS — H43813 Vitreous degeneration, bilateral: Secondary | ICD-10-CM | POA: Diagnosis not present

## 2020-04-02 DIAGNOSIS — L255 Unspecified contact dermatitis due to plants, except food: Secondary | ICD-10-CM | POA: Diagnosis not present

## 2020-04-18 DIAGNOSIS — Z23 Encounter for immunization: Secondary | ICD-10-CM | POA: Diagnosis not present

## 2020-04-20 DIAGNOSIS — Z23 Encounter for immunization: Secondary | ICD-10-CM | POA: Diagnosis not present

## 2020-04-28 ENCOUNTER — Telehealth: Payer: Medicare Other | Admitting: Family

## 2020-04-28 DIAGNOSIS — L237 Allergic contact dermatitis due to plants, except food: Secondary | ICD-10-CM

## 2020-04-28 MED ORDER — PREDNISONE 10 MG (21) PO TBPK: ORAL_TABLET | ORAL | 0 refills | Status: DC

## 2020-04-28 NOTE — Progress Notes (Signed)

## 2020-06-28 ENCOUNTER — Other Ambulatory Visit: Payer: Self-pay | Admitting: Family Medicine

## 2020-06-28 ENCOUNTER — Other Ambulatory Visit: Payer: Self-pay

## 2020-06-28 DIAGNOSIS — Z1231 Encounter for screening mammogram for malignant neoplasm of breast: Secondary | ICD-10-CM

## 2020-07-07 ENCOUNTER — Ambulatory Visit
Admission: RE | Admit: 2020-07-07 | Discharge: 2020-07-07 | Disposition: A | Discharge status: Home / Self Care | Payer: Medicare Other | Source: Ambulatory Visit

## 2020-07-07 ENCOUNTER — Other Ambulatory Visit: Payer: Self-pay

## 2020-07-07 DIAGNOSIS — Z1231 Encounter for screening mammogram for malignant neoplasm of breast: Secondary | ICD-10-CM | POA: Diagnosis not present

## 2020-07-19 ENCOUNTER — Other Ambulatory Visit: Payer: Self-pay

## 2020-07-19 ENCOUNTER — Ambulatory Visit (INDEPENDENT_AMBULATORY_CARE_PROVIDER_SITE_OTHER): Payer: Medicare Other | Admitting: Orthopaedic Surgery

## 2020-07-19 ENCOUNTER — Encounter: Payer: Self-pay | Admitting: Orthopaedic Surgery

## 2020-07-19 DIAGNOSIS — M7062 Trochanteric bursitis, left hip: Secondary | ICD-10-CM | POA: Diagnosis not present

## 2020-07-19 DIAGNOSIS — M7061 Trochanteric bursitis, right hip: Secondary | ICD-10-CM

## 2020-07-19 MED ORDER — METHYLPREDNISOLONE ACETATE 40 MG/ML IJ SUSP
13.3300 mg | INTRAMUSCULAR | Status: AC | PRN
  Administered 2020-07-19: 13.33 mg via INTRA_ARTICULAR

## 2020-07-19 MED ORDER — LIDOCAINE HCL 1 % IJ SOLN
0.5000 mL | INTRAMUSCULAR | Status: AC | PRN
  Administered 2020-07-19: .5 mL

## 2020-07-19 MED ORDER — BUPIVACAINE HCL 0.25 % IJ SOLN
0.6600 mL | INTRAMUSCULAR | Status: AC | PRN
  Administered 2020-07-19: .66 mL via INTRA_ARTICULAR

## 2020-07-19 NOTE — Progress Notes (Signed)
Office Visit Note   Patient: Donna Conner           Date of Birth: [birth year]           MRN: [id number] Visit Date: 07/19/2020              Requested by: Donita Brooks, MD [id number] [facility] [address] [place],  [place] [address] PCP: Donita Brooks, MD   Assessment & Plan: Visit Diagnoses:  1. Trochanteric bursitis of both hips     Plan: Pression is bilateral hip recurrent trochanteric bursitis left greater than right.  Today, we injected both trochanteric bursa's with cortisone.  She will continue to work on her iliotibial band exercise program.  She will call us if her symptoms return prior to 3 months from now.  Call with concerns or questions in the meantime.  Follow-Up Instructions: Return if symptoms worsen or fail to improve.   Orders:  No orders of the defined types were placed in this encounter.  No orders of the defined types were placed in this encounter.     Procedures: Large Joint Inj: bilateral greater trochanter on 07/19/2020 9:23 AM Indications: pain Details: 22 G needle, lateral approach Medications (Right): 0.5 mL lidocaine 1 %; 0.66 mL bupivacaine 0.25 %; 13.33 mg methylPREDNISolone acetate 40 MG/ML Medications (Left): 0.5 mL lidocaine 1 %; 0.66 mL bupivacaine 0.25 %; 13.33 mg methylPREDNISolone acetate 40 MG/ML      Clinical Data: No additional findings.   Subjective: Chief Complaint  Patient presents with  . Left Hip - Pain  . Right Hip - Pain    HPI patient is a very pleasant 69 year old female who comes in today with recurrent bilateral lateral hip pain left greater than right.  She was seen in our office about 8 months ago for this where she was diagnosed with trochanteric bursitis.  Injections performed to both sides at that point time significantly helped for several months.  Her pain has returned.  The pain is primarily to the lateral aspect but she does note occasional pain into the buttocks and anterior thigh.  Her pain is aggravated with  lunges as well as occasionally while sleeping at night.  She takes an occasional over-the-counter pain medication with relief of symptoms.  She does note occasional paresthesias to both feet.  She does note that she works on iliotibial band stretches at home.  Review of Systems as detailed in HPI.  All others reviewed and are negative.   Objective: Vital Signs: There were no vitals taken for this visit.  Physical Exam well-developed well-nourished female no acute distress.  Alert and oriented x3.  Ortho Exam bilateral hip exam shows negative logroll negative FADIR.  Marked tenderness to the greater trochanter.  Negative straight leg raise both sides.  No focal weakness.  She is neurovascular intact distally.  Specialty Comments:  No specialty comments available.  Imaging: No new imaging   PMFS History: Patient Active Problem List   Diagnosis Date Noted  . Pain in right elbow 04/14/2018  . Trochanteric bursitis of both hips 08/04/2017  . Prediabetes   . Colon polyps   . Hyperlipidemia    Past Medical History:  Diagnosis Date  . Anemia   . Arthritis    right hand  . Cancer (HCC)    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  . Cataract    bilateral repair  . Colon polyps   . Hyperlipidemia   . Prediabetes   . Trochanteric  bursitis of both hips 07/2017   received cortisone injections bilaterally  . Trochanteric bursitis, left hip 07/2017    Family History  Problem Relation Age of Onset  . Colon cancer Neg Hx   . Esophageal cancer Neg Hx   . Rectal cancer Neg Hx   . Stomach cancer Neg Hx   . Colon polyps Neg Hx     Past Surgical History:  Procedure Laterality Date  . CATARACT EXTRACTION W/PHACO Left 08/07/2017   Procedure: CATARACT EXTRACTION PHACO AND INTRAOCULAR LENS PLACEMENT (IOC);  Surgeon: Eulogio Bear, MD;  Location: [facility] ORS;  Service: Ophthalmology;  Laterality: Left;  Lot# [id number] [place]:   00:50.4 AP%:   13.4 CDE:    6.78  . CATARACT EXTRACTION W/PHACO  Right 08/28/2017   Procedure: CATARACT EXTRACTION PHACO AND INTRAOCULAR LENS PLACEMENT (IOC);  Surgeon: Eulogio Bear, MD;  Location: [facility] ORS;  Service: Ophthalmology;  Laterality: Right;  [place] 00:28.3 AP% 8.8 CDE 2.48 Fluid Pack lot # [id number]  . CERVICAL CONIZATION W/BX  1978  . CHOLECYSTECTOMY    . COLONOSCOPY  2016  . SKIN CANCER DESTRUCTION  07/29/06   basal cell  . TONSILLECTOMY AND ADENOIDECTOMY  1967   Social History   Occupational History  . Not on file  Tobacco Use  . Smoking status: Former Smoker    Quit date: 10/27/2003    Years since quitting: 16.7  . Smokeless tobacco: Never Used  Vaping Use  . Vaping Use: Never used  Substance and Sexual Activity  . Alcohol use: No  . Drug use: No  . Sexual activity: Yes    Comment: married to [place], retired

## 2020-09-06 ENCOUNTER — Other Ambulatory Visit: Payer: Medicare Other

## 2020-09-06 ENCOUNTER — Other Ambulatory Visit: Payer: Self-pay

## 2020-09-06 DIAGNOSIS — R7303 Prediabetes: Secondary | ICD-10-CM

## 2020-09-06 DIAGNOSIS — Z13220 Encounter for screening for lipoid disorders: Secondary | ICD-10-CM

## 2020-09-06 DIAGNOSIS — E785 Hyperlipidemia, unspecified: Secondary | ICD-10-CM

## 2020-09-07 LAB — CBC WITH DIFFERENTIAL/PLATELET
Absolute Monocytes: 549 cells/uL (ref 200–950)
Basophils Absolute: 79 cells/uL (ref 0–200)
Basophils Relative: 1.3 %
Eosinophils Absolute: 140 cells/uL (ref 15–500)
Eosinophils Relative: 2.3 %
HCT: 42.6 % (ref 35.0–45.0)
Hemoglobin: 14.8 g/dL (ref 11.7–15.5)
Lymphs Abs: 1751 cells/uL (ref 850–3900)
MCH: 31 pg (ref 27.0–33.0)
MCHC: 34.7 g/dL (ref 32.0–36.0)
MCV: 89.1 fL (ref 80.0–100.0)
MPV: 9.7 fL (ref 7.5–12.5)
Monocytes Relative: 9 %
Neutro Abs: 3581 cells/uL (ref 1500–7800)
Neutrophils Relative %: 58.7 %
Platelets: 316 10*3/uL (ref 140–400)
RBC: 4.78 10*6/uL (ref 3.80–5.10)
RDW: 12.2 % (ref 11.0–15.0)
Total Lymphocyte: 28.7 %
WBC: 6.1 10*3/uL (ref 3.8–10.8)

## 2020-09-07 LAB — COMPLETE METABOLIC PANEL WITH GFR
AG Ratio: 1.9 (calc) (ref 1.0–2.5)
ALT: 13 U/L (ref 6–29)
AST: 18 U/L (ref 10–35)
Albumin: 4.2 g/dL (ref 3.6–5.1)
Alkaline phosphatase (APISO): 89 U/L (ref 37–153)
BUN: 18 mg/dL (ref 7–25)
CO2: 27 mmol/L (ref 20–32)
Calcium: 9.7 mg/dL (ref 8.6–10.4)
Chloride: 103 mmol/L (ref 98–110)
Creat: 0.68 mg/dL (ref 0.60–0.93)
GFR, Est African American: 102 mL/min/{1.73_m2} (ref >=60)
GFR, Est Non African American: 88 mL/min/{1.73_m2} (ref >=60)
Globulin: 2.2 g/dL (calc) (ref 1.9–3.7)
Glucose, Bld: 122 mg/dL — ABNORMAL HIGH (ref 65–99)
Potassium: 4.6 mmol/L (ref 3.5–5.3)
Sodium: 137 mmol/L (ref 135–146)
Total Bilirubin: 1 mg/dL (ref 0.2–1.2)
Total Protein: 6.4 g/dL (ref 6.1–8.1)

## 2020-09-07 LAB — HEMOGLOBIN A1C
Hgb A1c MFr Bld: 5.6 % of total Hgb (ref <5.7)
Mean Plasma Glucose: 114 mg/dL
eAG (mmol/L): 6.3 mmol/L

## 2020-09-07 LAB — LIPID PANEL
Cholesterol: 210 mg/dL — ABNORMAL HIGH (ref <200)
HDL: 57 mg/dL (ref >=50)
LDL Cholesterol (Calc): 133 mg/dL (calc) — ABNORMAL HIGH
Non-HDL Cholesterol (Calc): 153 mg/dL (calc) — ABNORMAL HIGH (ref <130)
Total CHOL/HDL Ratio: 3.7 (calc) (ref <5.0)
Triglycerides: 96 mg/dL (ref <150)

## 2020-09-11 ENCOUNTER — Ambulatory Visit (INDEPENDENT_AMBULATORY_CARE_PROVIDER_SITE_OTHER): Payer: Medicare Other | Admitting: Family Medicine

## 2020-09-11 ENCOUNTER — Encounter: Payer: Self-pay | Admitting: Family Medicine

## 2020-09-11 ENCOUNTER — Other Ambulatory Visit: Payer: Self-pay

## 2020-09-11 VITALS — BP 118/74 | HR 93 | Temp 97.3°F | Ht 64.0 in | Wt 155.0 lb

## 2020-09-11 DIAGNOSIS — R7303 Prediabetes: Secondary | ICD-10-CM

## 2020-09-11 DIAGNOSIS — Z0001 Encounter for general adult medical examination with abnormal findings: Secondary | ICD-10-CM

## 2020-09-11 DIAGNOSIS — Z01818 Encounter for other preprocedural examination: Secondary | ICD-10-CM | POA: Diagnosis not present

## 2020-09-11 DIAGNOSIS — D126 Benign neoplasm of colon, unspecified: Secondary | ICD-10-CM

## 2020-09-11 DIAGNOSIS — E785 Hyperlipidemia, unspecified: Secondary | ICD-10-CM

## 2020-09-11 DIAGNOSIS — Z0000 Encounter for general adult medical examination without abnormal findings: Secondary | ICD-10-CM

## 2020-09-11 NOTE — Progress Notes (Signed)
Subjective:    Patient ID: Donna Conner, female    DOB: Apr 23, 1951, 70 y.o.   MRN: [id number]  HPI Patient is a very pleasant 70 year old Caucasian female who presents today for a Medicare wellness exam. However she is also requesting preoperative surgical clearance. She is planning on having elective cosmetic surgery performed on her face. By her description it sounds like she is considering a facelift. There is no other invasive surgery being planned. She denies any chest pain shortness of breath or dyspnea on exertion. She denies any orthopnea or signs of uncontrolled heart failure or unstable angina. Therefore I believe she is medically cleared to proceed with this low risk elective surgery. I did perform an EKG today which was showed normal sinus rhythm with normal intervals and a normal axis with no evidence of ischemia or infarction. Her most recent lab work is listed below but shows no anemia or renal or liver dysfunction that would prevent surgery. Therefore I believe she is medically cleared in the right hip. Therefore this is up-to-date. Her mammogram was performed in December 2021 and was normal and therefore this is up-to-date. She had a colonoscopy that showed 4 polyps but this was performed in April 2021 so this also is up-to-date. Her recent fasting blood sugar was elevated at 122 however her A1c was outstanding at 5.6. She is already started a low carbohydrate diet through weight watchers and is trying to start exercising more. Her cholesterol gives her a 10-year cardiovascular risk of 9.1%. However we discussed optimizing her risk factors and she elects against a statin Immunization History  Administered Date(s) Administered  . Hepatitis B 04/17/1995, 05/20/1995, 10/15/1995  . Influenza, High Dose Seasonal PF 06/12/2017  . Influenza,inj,Quad PF,6+ Mos 05/20/2013, 05/31/2014, 06/19/2015, 06/23/2018  . Influenza-Unspecified 05/15/2016  . Pneumococcal Conjugate-13 06/24/2016  .  Pneumococcal Polysaccharide-23 06/19/2015  . Tdap 05/18/2011, 05/31/2014  . Zoster 06/18/2011    Her most recent lab work as listed below: Lab on 09/06/2020  Component Date Value Ref Range Status  . WBC 09/06/2020 6.1  3.8 - 10.8 Thousand/uL Final  . RBC 09/06/2020 4.78  3.80 - 5.10 Million/uL Final  . Hemoglobin 09/06/2020 14.8  11.7 - 15.5 g/dL Final  . HCT 09/06/2020 42.6  35.0 - 45.0 % Final  . MCV 09/06/2020 89.1  80.0 - 100.0 fL Final  . MCH 09/06/2020 31.0  27.0 - 33.0 pg Final  . MCHC 09/06/2020 34.7  32.0 - 36.0 g/dL Final  . RDW 09/06/2020 12.2  11.0 - 15.0 % Final  . Platelets 09/06/2020 316  140 - 400 Thousand/uL Final  . MPV 09/06/2020 9.7  7.5 - 12.5 fL Final  . Neutro Abs 09/06/2020 3,581  1,500 - 7,800 cells/uL Final  . Lymphs Abs 09/06/2020 1,751  850 - 3,900 cells/uL Final  . Absolute Monocytes 09/06/2020 549  200 - 950 cells/uL Final  . Eosinophils Absolute 09/06/2020 140  15 - 500 cells/uL Final  . Basophils Absolute 09/06/2020 79  0 - 200 cells/uL Final  . Neutrophils Relative % 09/06/2020 58.7  % Final  . Total Lymphocyte 09/06/2020 28.7  % Final  . Monocytes Relative 09/06/2020 9.0  % Final  . Eosinophils Relative 09/06/2020 2.3  % Final  . Basophils Relative 09/06/2020 1.3  % Final  . Glucose, Bld 09/06/2020 122* 65 - 99 mg/dL Final   Comment: .            Fasting reference interval . For someone without known diabetes, a glucose  value between 100 and 125 mg/dL is consistent with prediabetes and should be confirmed with a follow-up test. .   . BUN 09/06/2020 18  7 - 25 mg/dL Final  . Creat 09/06/2020 0.68  0.60 - 0.93 mg/dL Final   Comment: For patients >60 years of age, the reference limit for Creatinine is approximately 13% higher for people identified as African-American. .   . GFR, Est Non African American 09/06/2020 88  > OR = 60 mL/min/1.68m2 Final  . GFR, Est African American 09/06/2020 102  > OR = 60 mL/min/1.11m2 Final  . BUN/Creatinine  Ratio 01/19/7627 NOT APPLICABLE  6 - 22 (calc) Final  . Sodium 09/06/2020 137  135 - 146 mmol/L Final  . Potassium 09/06/2020 4.6  3.5 - 5.3 mmol/L Final  . Chloride 09/06/2020 103  98 - 110 mmol/L Final  . CO2 09/06/2020 27  20 - 32 mmol/L Final  . Calcium 09/06/2020 9.7  8.6 - 10.4 mg/dL Final  . Total Protein 09/06/2020 6.4  6.1 - 8.1 g/dL Final  . Albumin 09/06/2020 4.2  3.6 - 5.1 g/dL Final  . Globulin 09/06/2020 2.2  1.9 - 3.7 g/dL (calc) Final  . AG Ratio 09/06/2020 1.9  1.0 - 2.5 (calc) Final  . Total Bilirubin 09/06/2020 1.0  0.2 - 1.2 mg/dL Final  . Alkaline phosphatase (APISO) 09/06/2020 89  37 - 153 U/L Final  . AST 09/06/2020 18  10 - 35 U/L Final  . ALT 09/06/2020 13  6 - 29 U/L Final  . Cholesterol 09/06/2020 210* <200 mg/dL Final  . HDL 09/06/2020 57  > OR = 50 mg/dL Final  . Triglycerides 09/06/2020 96  <150 mg/dL Final  . LDL Cholesterol (Calc) 09/06/2020 133* mg/dL (calc) Final   Comment: Reference range: <100 . Desirable range <100 mg/dL for primary prevention;   <70 mg/dL for patients with CHD or diabetic patients  with > or = 2 CHD risk factors. Marland Kitchen LDL-C is now calculated using the Martin-Hopkins  calculation, which is a validated novel method providing  better accuracy than the Friedewald equation in the  estimation of LDL-C.  Cresenciano Genre et al. Annamaria Helling. 3151;761(60): 2061-2068  (http://education.[facility].com/faq/FAQ164)   . Total CHOL/HDL Ratio 09/06/2020 3.7  <5.0 (calc) Final  . Non-HDL Cholesterol (Calc) 09/06/2020 153* <130 mg/dL (calc) Final   Comment: For patients with diabetes plus 1 major ASCVD risk  factor, treating to a non-HDL-C goal of <100 mg/dL  (LDL-C of <70 mg/dL) is considered a therapeutic  option.   . Hgb A1c MFr Bld 09/06/2020 5.6  <5.7 % of total Hgb Final   Comment: For the purpose of screening for the presence of diabetes: . <5.7%       Consistent with the absence of diabetes 5.7-6.4%    Consistent with increased risk for  diabetes             (prediabetes) > or =6.5%  Consistent with diabetes . This assay result is consistent with a decreased risk of diabetes. . Currently, no consensus exists regarding use of hemoglobin A1c for diagnosis of diabetes in children. . According to American Diabetes Association (ADA) guidelines, hemoglobin A1c <7.0% represents optimal control in non-pregnant diabetic patients. Different metrics may apply to specific patient populations.  Standards of Medical Care in Diabetes(ADA). .   . Mean Plasma Glucose 09/06/2020 114  mg/dL Final  . eAG (mmol/L) 09/06/2020 6.3  mmol/L Final   Past Medical History:  Diagnosis Date  . Anemia   .  Arthritis    right hand  . 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 Cataract    bilateral repair  . Colon polyps   . Hyperlipidemia   . Prediabetes   . Trochanteric bursitis of both hips 07/2017   received cortisone injections bilaterally  . Trochanteric bursitis, left hip 07/2017   Past Surgical History:  Procedure Laterality Date  . CATARACT EXTRACTION W/PHACO Left 08/07/2017   Procedure: CATARACT EXTRACTION PHACO AND INTRAOCULAR LENS PLACEMENT (IOC);  Surgeon: Eulogio Bear, MD;  Location: [facility] ORS;  Service: Ophthalmology;  Laterality: Left;  Lot# [id number] H [place]:   00:50.4 AP%:   13.4 CDE:    6.78  . CATARACT EXTRACTION W/PHACO Right 08/28/2017   Procedure: CATARACT EXTRACTION PHACO AND INTRAOCULAR LENS PLACEMENT (IOC);  Surgeon: Eulogio Bear, MD;  Location: [facility] ORS;  Service: Ophthalmology;  Laterality: Right;  [place] 00:28.3 AP% 8.8 CDE 2.48 Fluid Pack lot # [id number]  . CERVICAL CONIZATION W/BX  1978  . CHOLECYSTECTOMY    . COLONOSCOPY  2016  . SKIN CANCER DESTRUCTION  07/29/06   basal cell  . TONSILLECTOMY AND ADENOIDECTOMY  1967   Current Outpatient Medications on File Prior to Visit  Medication Sig Dispense Refill  . acetaminophen (TYLENOL) 500 MG tablet Take 500-1,000 mg by mouth every 6 (six)  hours as needed for moderate pain or headache.     . Calcium Carbonate-Vitamin D (CALCIUM-VITAMIN D3 PO) Take 1 tablet by mouth daily.    . Cyanocobalamin (VITAMIN B 12 PO) Take by mouth.     No current facility-administered medications on file prior to visit.   No Known Allergies Social History   Socioeconomic History  . Marital status: Married    Spouse name: Not on file  . Number of children: Not on file  . Years of education: Not on file  . Highest education level: Not on file  Occupational History  . Not on file  Tobacco Use  . Smoking status: Former Smoker    Quit date: 10/27/2003    Years since quitting: 16.8  . Smokeless tobacco: Never Used  Vaping Use  . Vaping Use: Never used  Substance and Sexual Activity  . Alcohol use: No  . Drug use: No  . Sexual activity: Yes    Comment: married to [place], retired  Other Topics Concern  . Not on file  Social History Narrative  . Not on file   Social Determinants of Health   Financial Resource Strain: Not on file  Food Insecurity: Not on file  Transportation Needs: Not on file  Physical Activity: Not on file  Stress: Not on file  Social Connections: Not on file  Intimate Partner Violence: Not on file   Family History  Problem Relation Age of Onset  . Colon cancer Neg Hx   . Esophageal cancer Neg Hx   . Rectal cancer Neg Hx   . Stomach cancer Neg Hx   . Colon polyps Neg Hx     Review of Systems  All other systems reviewed and are negative.      Objective:   Physical Exam Vitals reviewed.  Constitutional:      General: She is not in acute distress.    Appearance: She is well-developed. She is not diaphoretic.  HENT:     Head: Normocephalic and atraumatic.     Right Ear: External ear normal.     Left Ear: External ear normal.  Nose: Nose normal.     Mouth/Throat:     Pharynx: No oropharyngeal exudate.  Eyes:     General: No scleral icterus.       Right eye: No discharge.        Left eye: No  discharge.     Conjunctiva/sclera: Conjunctivae normal.     Pupils: Pupils are equal, round, and reactive to light.  Neck:     Thyroid: No thyromegaly.     Vascular: No JVD.     Trachea: No tracheal deviation.  Cardiovascular:     Rate and Rhythm: Normal rate and regular rhythm.     Heart sounds: Normal heart sounds. No murmur heard. No friction rub. No gallop.   Pulmonary:     Effort: Pulmonary effort is normal. No respiratory distress.     Breath sounds: Normal breath sounds. No stridor. No wheezing or rales.  Chest:     Chest wall: No tenderness.  Abdominal:     General: Bowel sounds are normal. There is no distension.     Palpations: Abdomen is soft. There is no mass.     Tenderness: There is no abdominal tenderness. There is no guarding or rebound.  Musculoskeletal:        General: No tenderness. Normal range of motion.     Cervical back: Normal range of motion and neck supple.  Lymphadenopathy:     Cervical: No cervical adenopathy.  Skin:    General: Skin is warm.     Coloration: Skin is not pale.     Findings: No erythema or rash.  Neurological:     Mental Status: She is alert and oriented to person, place, and time.     Cranial Nerves: No cranial nerve deficit.     Motor: No abnormal muscle tone.     Coordination: Coordination normal.     Deep Tendon Reflexes: Reflexes are normal and symmetric.  Psychiatric:        Behavior: Behavior normal.        Thought Content: Thought content normal.        Judgment: Judgment normal.           Assessment & Plan:  Prediabetes  Preoperative clearance - Plan: EKG 12-Lead  Hyperlipidemia, unspecified hyperlipidemia type  Adenomatous polyp of colon, unspecified part of colon  General medical exam  Medical exam today is completely normal. Blood pressure is excellent. EKG is normal. Mammogram, colonoscopy, and bone density test are up-to-date. Immunizations are up-to-date. Lab work is significant for a fasting blood  sugar of 122 which would qualify as prediabetes however an A1c of 5.6 which is well controlled. I recommended a low carbohydrate diet and 30 minutes a day of aerobic exercise and recheck this in 6 months. Cholesterol is elevated and we discussed the pros and cons of starting a statin. The patient elects against taking a statin but plans to follow-up with fasting blood work in 6 months. She denies any problems with memory loss, falls, or depression. She is medically cleared to proceed with her upcoming surgery.

## 2020-09-14 ENCOUNTER — Encounter: Payer: Self-pay | Admitting: Family Medicine

## 2021-03-16 ENCOUNTER — Other Ambulatory Visit: Payer: Self-pay

## 2021-03-16 ENCOUNTER — Ambulatory Visit (INDEPENDENT_AMBULATORY_CARE_PROVIDER_SITE_OTHER): Payer: Medicare Other | Admitting: Nurse Practitioner

## 2021-03-16 ENCOUNTER — Encounter: Payer: Self-pay | Admitting: Family Medicine

## 2021-03-16 ENCOUNTER — Encounter: Payer: Self-pay | Admitting: Nurse Practitioner

## 2021-03-16 VITALS — BP 146/88 | HR 77 | Temp 98.4°F | Ht 64.0 in | Wt 165.0 lb

## 2021-03-16 DIAGNOSIS — R21 Rash and other nonspecific skin eruption: Secondary | ICD-10-CM | POA: Diagnosis not present

## 2021-03-16 MED ORDER — MOMETASONE FUROATE 0.1 % EX OINT: TOPICAL_OINTMENT | Freq: Two times a day (BID) | CUTANEOUS | 0 refills | Status: DC

## 2021-03-16 MED ORDER — PREDNISONE 10 MG PO TABS: ORAL_TABLET | ORAL | 0 refills | Status: AC

## 2021-03-16 NOTE — Progress Notes (Signed)
Subjective:    Patient ID: Donna Conner, female    DOB: 1951-02-25, 70 y.o.   MRN: [id number]  HPI: Donna Conner is a 70 y.o. female presenting for rash.  Chief Complaint  Patient presents with   Rash    RASH At the end of July, treated for poison ivy with prednisone taper and steroid injection at Urgent care.  Symptoms recurred after treatment and urgent care prescribed longer 6 day taper pack.  Symptoms have recurred again starting 3 days ago. Duration:  days  Location: arm, face  Itching: yes Burning: no Redness: yes Oozing: no Scaling: no Blisters: no Painful: no Fevers: no Change in detergents/soaps/personal care products: no History of same: yes Context: fluctuating Alleviating factors: cold water,  Treatments attempted: coconut oil, prednisone 2 times, water Shortness of breath: no  Throat/tongue swelling: no Myalgias/arthralgias: no  No Known Allergies  Outpatient Encounter Medications as of 03/16/2021  Medication Sig   mometasone (ELOCON) 0.1 % ointment Apply topically 2 (two) times daily.   predniSONE (DELTASONE) 10 MG tablet Take 4 tablets (40 mg total) by mouth daily with breakfast for 5 days, THEN 2 tablets (20 mg total) daily with breakfast for 5 days, THEN 1 tablet (10 mg total) daily with breakfast for 5 days.   acetaminophen (TYLENOL) 500 MG tablet Take 500-1,000 mg by mouth every 6 (six) hours as needed for moderate pain or headache.    Calcium Carbonate-Vitamin D (CALCIUM-VITAMIN D3 PO) Take 1 tablet by mouth daily.   Cyanocobalamin (VITAMIN B 12 PO) Take by mouth.   No facility-administered encounter medications on file as of 03/16/2021.    Patient Active Problem List   Diagnosis Date Noted   Pain in right elbow 04/14/2018   Trochanteric bursitis of both hips 08/04/2017   Prediabetes    Colon polyps    Hyperlipidemia     Past Medical History:  Diagnosis Date   Anemia    Arthritis    right hand   Cancer ([place])    basal cell cancer removed  by right eye   Cataract    bilateral repair   Colon polyps    Hyperlipidemia    Prediabetes    Trochanteric bursitis of both hips 07/2017   received cortisone injections bilaterally   Trochanteric bursitis, left hip 07/2017    Relevant past medical, surgical, family and social history reviewed and updated as indicated. Interim medical history since our last visit reviewed.  Review of Systems Per HPI unless specifically indicated above     Objective:    BP (!) 146/88   Pulse 77   Temp 98.4 F (36.9 C) (Oral)   Ht '5\' 4"'$  (1.626 m)   Wt 165 lb (74.8 kg)   SpO2 99%   BMI 28.32 kg/m   Wt Readings from Last 3 Encounters:  03/16/21 165 lb (74.8 kg)  09/11/20 155 lb (70.3 kg)  11/24/19 155 lb (70.3 kg)    Physical Exam Vitals and nursing note reviewed.  Constitutional:      General: She is not in acute distress.    Appearance: Normal appearance. She is not toxic-appearing.  HENT:     Mouth/Throat:     Mouth: Mucous membranes are moist.     Pharynx: Oropharynx is clear.  Pulmonary:     Effort: Pulmonary effort is normal. No respiratory distress.  Skin:    General: Skin is warm and dry.     Capillary Refill: Capillary refill takes less than 2 seconds.  Findings: Erythema and rash present. Rash is papular and urticarial.       Neurological:     Mental Status: She is alert and oriented to person, place, and time.     Motor: No weakness.     Gait: Gait normal.  Psychiatric:        Mood and Affect: Mood normal.        Behavior: Behavior normal.        Thought Content: Thought content normal.        Judgment: Judgment normal.      Assessment & Plan:  1. Rash and nonspecific skin eruption Acute.  Will initiate treatment with high potency steroid ointment.  Not to apply to face.  If no better in 2 days, start extended steroid taper.  Return to clinic if symptoms persist or return after treatment.   - predniSONE (DELTASONE) 10 MG tablet; Take 4 tablets (40 mg total)  by mouth daily with breakfast for 5 days, THEN 2 tablets (20 mg total) daily with breakfast for 5 days, THEN 1 tablet (10 mg total) daily with breakfast for 5 days.  Dispense: 35 tablet; Refill: 0 - mometasone (ELOCON) 0.1 % ointment; Apply topically 2 (two) times daily.  Dispense: 45 g; Refill: 0     Follow up plan: Return if symptoms worsen or fail to improve.

## 2021-03-16 NOTE — Patient Instructions (Signed)
Poison Ivy Dermatitis Poison ivy dermatitis is redness and soreness of the skin caused by chemicals in the leaves of the poison ivy plant. You may have very bad itching, swelling,a rash, and blisters. What are the causes? Touching a poison ivy plant. Touching something that has the chemical on it. This may include animals or objects that have come in contact with the plant. What increases the risk? Going outdoors often in wooded or [place] areas. Going outdoors without wearing protective clothing, such as closed shoes, long pants, and a long-sleeved shirt. What are the signs or symptoms?  Skin redness. Very bad itching. A rash that often includes bumps and blisters. The rash usually appears 48 hours after exposure, if you have been exposed before. If this is the first time you have been exposed, the rash may not appear until a week after exposure. Swelling. This may occur if the reaction is very bad. Symptoms usually last for 1-2 weeks. The first time you develop this condition,symptoms may last 3-4 weeks. How is this treated? This condition may be treated with: Hydrocortisone cream or calamine lotion to relieve itching. Oatmeal baths to soothe the skin. Medicines, such as over-the-counter antihistamine tablets. Oral steroid medicine for more severe reactions. Follow these instructions at home: Medicines Take or apply over-the-counter and prescription medicines only as told by your doctor. Use hydrocortisone cream or calamine lotion as needed to help with itching. General instructions Do not scratch or rub your skin. Put a cold, wet cloth (cold compress) on the affected areas or take baths in cool water. This will help with itching. Avoid hot baths and showers. Take oatmeal baths as needed. Use colloidal oatmeal. You can get this at a pharmacy or grocery store. Follow the instructions on the package. While you have the rash, wash your clothes right after you wear them. Keep all  follow-up visits as told by your health care provider. This is important. How is this prevented?  Know what poison ivy looks like, so you can avoid it. This plant has three leaves with flowering branches on a single stem. The leaves are glossy. The leaves have uneven edges that come to a point at the front. If you touch poison ivy, wash your skin with soap and water right away. Be sure to wash under your fingernails. When hiking or camping, wear long pants, a long-sleeved shirt, tall socks, and hiking boots. You can also use a lotion on your skin that helps to prevent contact with poison ivy. If you think that your clothes or outdoor gear came in contact with poison ivy, rinse them off with a garden hose before you bring them inside your house. When doing yard work or gardening, wear gloves, long sleeves, long pants, and boots. Wash your garden tools and gloves if they come in contact with poison ivy. If you think that your pet has come into contact with poison ivy, wash him or her with pet shampoo and water. Make sure to wear gloves while washing your pet.  Contact a doctor if: You have open sores in the rash area. You have more redness, swelling, or pain in the rash area. You have redness that spreads beyond the rash area. You have fluid, blood, or pus coming from the rash area. You have a fever. You have a rash over a large area of your body. You have a rash on your eyes, mouth, or genitals. Your rash does not get better after a few weeks. Get help right away if:  Your face swells or your eyes swell shut. You have trouble breathing. You have trouble swallowing. These symptoms may be an emergency. Do not wait to see if the symptoms will go away. Get medical help right away. Call your local emergency services (911 in the [place] Do not drive yourself to the hospital. Summary Poison ivy dermatitis is redness and soreness of the skin caused by chemicals in the leaves of the poison ivy  plant. You may have skin redness, very bad itching, swelling, and a rash. Do not scratch or rub your skin. Take or apply over-the-counter and prescription medicines only as told by your doctor. This information is not intended to replace advice given to you by your health care provider. Make sure you discuss any questions you have with your healthcare provider. Document Revised: 11/06/2018 Document Reviewed: 07/10/2018 Elsevier Patient Education  2022 [employer].

## 2021-03-28 ENCOUNTER — Encounter: Payer: Self-pay | Admitting: Family Medicine

## 2021-03-28 MED ORDER — NIRMATRELVIR/RITONAVIR (PAXLOVID)TABLET: 3.0000 | ORAL_TABLET | Freq: Two times a day (BID) | ORAL | 0 refills | Status: AC

## 2021-06-11 ENCOUNTER — Other Ambulatory Visit: Payer: Self-pay | Admitting: Family Medicine

## 2021-06-11 DIAGNOSIS — Z1231 Encounter for screening mammogram for malignant neoplasm of breast: Secondary | ICD-10-CM

## 2021-07-13 ENCOUNTER — Ambulatory Visit
Admission: RE | Admit: 2021-07-13 | Discharge: 2021-07-13 | Disposition: A | Discharge status: Home / Self Care | Payer: Medicare Other | Source: Ambulatory Visit | Attending: Family Medicine | Admitting: Family Medicine

## 2021-07-13 DIAGNOSIS — Z1231 Encounter for screening mammogram for malignant neoplasm of breast: Secondary | ICD-10-CM

## 2021-09-14 ENCOUNTER — Ambulatory Visit: Payer: Medicare Other

## 2021-09-20 ENCOUNTER — Ambulatory Visit (INDEPENDENT_AMBULATORY_CARE_PROVIDER_SITE_OTHER): Payer: Medicare Other

## 2021-09-20 ENCOUNTER — Other Ambulatory Visit: Payer: Self-pay

## 2021-09-20 VITALS — Ht 64.0 in | Wt 155.0 lb

## 2021-09-20 DIAGNOSIS — Z0000 Encounter for general adult medical examination without abnormal findings: Secondary | ICD-10-CM

## 2021-09-20 DIAGNOSIS — M8580 Other specified disorders of bone density and structure, unspecified site: Secondary | ICD-10-CM | POA: Diagnosis not present

## 2021-09-20 NOTE — Progress Notes (Signed)
Subjective:   Donna Conner is a 71 y.o. female who presents for Medicare Annual (Subsequent) preventive examination. Virtual Visit via Telephone Note  I connected with  [name] on 09/20/21 at 10:30 AM EST by telephone and verified that I am speaking with the correct person using two identifiers.  Location: Patient: HOME Provider: [facility] Persons participating in the virtual visit: patient/Nurse Health Advisor   I discussed the limitations, risks, security and privacy concerns of performing an evaluation and management service by telephone and the availability of in person appointments. The patient expressed understanding and agreed to proceed.  Interactive audio and video telecommunications were attempted between this nurse and patient, however failed, due to patient having technical difficulties OR patient did not have access to video capability.  We continued and completed visit with audio only.  Some vital signs may be absent or patient reported.   Chriss Driver, LPN  Review of Systems      Cardiac Risk Factors include: advanced age (>10men, >10 women);hypertension;dyslipidemia     Objective:    Today's Vitals   09/20/21 1029  Weight: 155 lb (70.3 kg)  Height: 5\' 4"  (1.626 m)   Body mass index is 26.61 kg/m.  Advanced Directives 09/20/2021 03/23/2018 08/28/2017 08/07/2017 01/04/2016 09/23/2014  Does Patient Have a Medical Advance Directive? Yes No No No Yes No  Type of Paramedic of [place];Living will - - - - -  Copy of [place] in Chart? No - copy requested - - - No - copy requested -  Would patient like information on creating a medical advance directive? - - Yes (Inpatient - patient requests chaplain consult to create a medical advance directive) - - No - patient declined information    Current Medications (verified) Outpatient Encounter Medications as of 09/20/2021  Medication Sig   acetaminophen (TYLENOL) 500 MG  tablet Take 500-1,000 mg by mouth every 6 (six) hours as needed for moderate pain or headache.    Calcium Carbonate-Vitamin D (CALCIUM-VITAMIN D3 PO) Take 1 tablet by mouth daily.   Cyanocobalamin (VITAMIN B 12 PO) Take by mouth.   [DISCONTINUED] mometasone (ELOCON) 0.1 % ointment Apply topically 2 (two) times daily.   No facility-administered encounter medications on file as of 09/20/2021.    Allergies (verified) Patient has no known allergies.   History: Past Medical History:  Diagnosis Date   Anemia    Arthritis    right hand   Cancer ([place])    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   Cataract    bilateral repair   Colon polyps    Hyperlipidemia    Prediabetes    Trochanteric bursitis of both hips 07/2017   received cortisone injections bilaterally   Trochanteric bursitis, left hip 07/2017   Past Surgical History:  Procedure Laterality Date   CATARACT EXTRACTION W/PHACO Left 08/07/2017   Procedure: CATARACT EXTRACTION PHACO AND INTRAOCULAR LENS PLACEMENT ([place]);  Surgeon: Eulogio Bear, MD;  Location: [facility] ORS;  Service: Ophthalmology;  Laterality: Left;  Lot# [id number] H [place]:   00:50.4 AP%:   13.4 CDE:    6.78   CATARACT EXTRACTION W/PHACO Right 08/28/2017   Procedure: CATARACT EXTRACTION PHACO AND INTRAOCULAR LENS PLACEMENT ([facility]);  Surgeon: Eulogio Bear, MD;  Location: [facility] ORS;  Service: Ophthalmology;  Laterality: Right;  [place] 00:28.3 AP% 8.8 CDE 2.48 Fluid Pack lot # [id number]   CERVICAL CONIZATION W/BX  1978   CHOLECYSTECTOMY     COLONOSCOPY  2016  SKIN CANCER DESTRUCTION  07/29/06   basal cell   TONSILLECTOMY AND ADENOIDECTOMY  1967   Family History  Problem Relation Age of Onset   Colon cancer Neg Hx    Esophageal cancer Neg Hx    Rectal cancer Neg Hx    Stomach cancer Neg Hx    Colon polyps Neg Hx    Social History   Socioeconomic History   Marital status: Married    Spouse name: John   Number of children: 1   Years of education: Not on file    Highest education level: Not on file  Occupational History   Not on file  Tobacco Use   Smoking status: Former    Types: Cigarettes    Quit date: 10/26/1993    Years since quitting: 27.9   Smokeless tobacco: Never  Vaping Use   Vaping Use: Never used  Substance and Sexual Activity   Alcohol use: No   Drug use: No   Sexual activity: Yes    Comment: married to Bassett, retired  Other Topics Concern   Not on file  Social History Narrative   1 son. 2 step sons   6 grandchildren   4 great grand children.   Social Determinants of Health   Financial Resource Strain: Low Risk    Difficulty of Paying Living Expenses: Not hard at all  Food Insecurity: No Food Insecurity   Worried About Charity fundraiser in the Last Year: Never true   [place] in the Last Year: Never true  Transportation Needs: No Transportation Needs   Lack of Transportation (Medical): No   Lack of Transportation (Non-Medical): No  Physical Activity: Sufficiently Active   Days of Exercise per Week: 4 days   Minutes of Exercise per Session: 60 min  Stress: No Stress Concern Present   Feeling of Stress : Not at all  Social Connections: Socially Integrated   Frequency of Communication with Friends and Family: More than three times a week   Frequency of Social Gatherings with Friends and Family: More than three times a week   Attends Religious Services: More than 4 times per year   Active Member of [employer] or Organizations: Yes   Attends Music therapist: More than 4 times per year   Marital Status: Married    Tobacco Counseling Counseling given: Not Answered   Clinical Intake:  Pre-visit preparation completed: Yes  Pain : No/denies pain     BMI - recorded: 26.61 Nutritional Status: BMI 25 -29 Overweight Nutritional Risks: None Diabetes: No  How often do you need to have someone help you when you read instructions, pamphlets, or other written materials from your doctor or pharmacy?: 1  - Never  Diabetic?No  Interpreter Needed?: No  Information entered by :: mj Ashland Wiseman, lpn   Activities of Daily Living In your present state of health, do you have any difficulty performing the following activities: 09/20/2021  Hearing? N  Vision? N  Difficulty concentrating or making decisions? N  Walking or climbing stairs? N  Dressing or bathing? N  Doing errands, shopping? N  Preparing Food and eating ? N  Using the Toilet? N  In the past six months, have you accidently leaked urine? N  Do you have problems with loss of bowel control? N  Managing your Medications? N  Managing your Finances? N  Housekeeping or managing your Housekeeping? N  Some recent data might be hidden    Patient Care Team: [place],  Cammie Mcgee, MD as PCP - General (Family Medicine) Dennard Schaumann Cammie Mcgee, MD (Family Medicine)  Indicate any recent Medical Services you may have received from other than [facility] providers in the past year (date may be approximate).     Assessment:   This is a routine wellness examination for [place].  Hearing/Vision screen Hearing Screening - Comments:: No hearing issues.  Vision Screening - Comments:: Readers. Dr. Edison Pace at [facility]. 12/2020.  Dietary issues and exercise activities discussed: Current Exercise Habits: Structured exercise class, Type of exercise: Other - see comments (Zumba, Drum, [employer]), Time (Minutes): 60, Frequency (Times/Week): 4, Weekly Exercise (Minutes/Week): 240, Intensity: Moderate, Exercise limited by: cardiac condition(s);orthopedic condition(s)   Goals Addressed             This Visit's Progress    Exercise 3x per week (30 min per time)       Continue to exercise and stay healthy.       Depression Screen PHQ 2/9 Scores 09/20/2021 07/13/2018 07/13/2018 09/16/2017 07/11/2017 07/11/2017 09/10/2016  PHQ - 2 Score 0 0 0 0 0 0 0    Fall Risk Fall Risk  09/20/2021 09/11/2020 09/11/2020 07/13/2018 07/11/2017  Falls in the past year? 0  0 0 0 No  Comment - - - - -  Number falls in past yr: 0 0 0 - -  Injury with Fall? 0 0 0 - -  Risk for fall due to : Impaired balance/gait - - - -  Follow up Falls prevention discussed - - Falls evaluation completed -    FALL RISK PREVENTION PERTAINING TO THE HOME:  Any stairs in or around the home? Yes  If so, are there any without handrails? No  Home free of loose throw rugs in walkways, pet beds, electrical cords, etc? Yes  Adequate lighting in your home to reduce risk of falls? Yes   ASSISTIVE DEVICES UTILIZED TO PREVENT FALLS:  Life alert? No  Use of a cane, walker or w/c? No  Grab bars in the bathroom? No  Shower chair or bench in shower? Yes  Elevated toilet seat or a handicapped toilet? Yes   TIMED UP AND GO:  Was the test performed? No .  Phone visit.  Cognitive Function: Normal cognitive status assessed by direct observation by this Nurse Health Advisor. No abnormalities found.       6CIT Screen 09/11/2020 09/11/2020  What Year? 0 points 0 points  What month? 0 points 0 points  What time? 0 points 0 points  Count back from 20 0 points 0 points  Months in reverse 0 points 0 points  Repeat phrase 0 points 0 points  Total Score 0 0    Immunizations Immunization History  Administered Date(s) Administered   Hepatitis B 04/17/1995, 05/20/1995, 10/15/1995   Influenza, High Dose Seasonal PF 06/12/2017   Influenza,inj,Quad PF,6+ Mos 05/20/2013, 05/31/2014, 06/19/2015, 06/23/2018   Influenza-Unspecified 05/15/2016   Pneumococcal Conjugate-13 06/24/2016   Pneumococcal Polysaccharide-23 06/19/2015   Tdap 05/18/2011, 05/31/2014   Zoster, Live 06/18/2011    TDAP status: Up to date  Flu Vaccine status: Up to date  Pneumococcal vaccine status: Up to date  Covid-19 vaccine status: Completed vaccines  Qualifies for Shingles Vaccine? Yes   Zostavax completed Yes   Shingrix Completed?: No.    Education has been provided regarding the importance of this vaccine.  Patient has been advised to call insurance company to determine out of pocket expense if they have not yet received this vaccine. Advised  may also receive vaccine at local pharmacy or Health Dept. Verbalized acceptance and understanding.  Screening Tests Health Maintenance  Topic Date Due   COVID-19 Vaccine (1) Never done   FOOT EXAM  Never done   OPHTHALMOLOGY EXAM  Never done   URINE MICROALBUMIN  Never done   Zoster Vaccines- Shingrix (1 of 2) Never done   INFLUENZA VACCINE  02/26/2021   HEMOGLOBIN A1C  03/06/2021   COLONOSCOPY (Pts 45-87yrs Insurance coverage will need to be confirmed)  11/24/2022   MAMMOGRAM  07/14/2023   TETANUS/TDAP  05/31/2024   Pneumonia Vaccine 24+ Years old  Completed   DEXA SCAN  Completed   Hepatitis C Screening  Completed   HPV VACCINES  Aged Out    Health Maintenance  Health Maintenance Due  Topic Date Due   COVID-19 Vaccine (1) Never done   FOOT EXAM  Never done   OPHTHALMOLOGY EXAM  Never done   URINE MICROALBUMIN  Never done   Zoster Vaccines- Shingrix (1 of 2) Never done   INFLUENZA VACCINE  02/26/2021   HEMOGLOBIN A1C  03/06/2021    Colorectal cancer screening: Type of screening: Colonoscopy. Completed 11/24/2019. Repeat every 3 years  Mammogram status: Completed 07/13/2021. Repeat every year  Bone Density status: Completed 08/11/2017. Results reflect: Bone density results: OSTEOPENIA. Repeat every 2 years.  Lung Cancer Screening: (Low Dose CT Chest recommended if Age 62-80 years, 30 pack-year currently smoking OR have quit w/in 15years.) does not qualify.    Additional Screening:  Hepatitis C Screening: does qualify; Completed 02/05/2016  Vision Screening: Recommended annual ophthalmology exams for early detection of glaucoma and other disorders of the eye. Is the patient up to date with their annual eye exam?  Yes  Who is the provider or what is the name of the office in which the patient attends annual eye exams? [facility]. If pt is not established with a provider, would they like to be referred to a provider to establish care? No .   Dental Screening: Recommended annual dental exams for proper oral hygiene  Community Resource Referral / Chronic Care Management: CRR required this visit?  No   CCM required this visit?  No      Plan:     I have personally reviewed and noted the following in the patients chart:   Medical and social history Use of alcohol, tobacco or illicit drugs  Current medications and supplements including opioid prescriptions.  Functional ability and status Nutritional status Physical activity Advanced directives List of other physicians Hospitalizations, surgeries, and ER visits in previous 12 months Vitals Screenings to include cognitive, depression, and falls Referrals and appointments  In addition, I have reviewed and discussed with patient certain preventive protocols, quality metrics, and best practice recommendations. A written personalized care plan for preventive services as well as general preventive health recommendations were provided to patient.     Chriss Driver, LPN   6/81/2751   Nurse Notes: Pt is up to date on health maintenance and vaccines. Discussed bone density and pt would like to repeat. Order placed. Pt asked to bring copy of Covid vaccine card for documentation of date.

## 2021-09-20 NOTE — Patient Instructions (Signed)
Ms. Donna Conner , Thank you for taking time to come for your Medicare Wellness Visit. I appreciate your ongoing commitment to your health goals. Please review the following plan we discussed and let me know if I can assist you in the future.   Screening recommendations/referrals: Colonoscopy: Done 11/24/2019 Repeat every 3 years.  Mammogram: Done 07/13/2021. Repeat annually  Bone Density: Done 08/01/2017. Order placed today for repeat.  Recommended yearly ophthalmology/optometry visit for glaucoma screening and checkup Recommended yearly dental visit for hygiene and checkup  Vaccinations: Influenza vaccine: Done 04/2021. Pneumococcal vaccine: Done 06/19/2015 and 06/24/2016 Tdap vaccine: Done 05/31/2014 Repeat in 10 years  Shingles vaccine: Zoster done 06/18/2011. Discussed Shingrix.   Covid-19:Please bring copy of vaccine card to next visit for documentation in chart.   Advanced directives: Copies in chart.  Conditions/risks identified: KEEP UP THE GOOD WORK!!  Next appointment: Follow up in one year for your annual wellness visit 2024.   Preventive Care 29 Years and Older, Female Preventive care refers to lifestyle choices and visits with your health care provider that can promote health and wellness. What does preventive care include? A yearly physical exam. This is also called an annual well check. Dental exams once or twice a year. Routine eye exams. Ask your health care provider how often you should have your eyes checked. Personal lifestyle choices, including: Daily care of your teeth and gums. Regular physical activity. Eating a healthy diet. Avoiding tobacco and drug use. Limiting alcohol use. Practicing safe sex. Taking low-dose aspirin every day. Taking vitamin and mineral supplements as recommended by your health care provider. What happens during an annual well check? The services and screenings done by your health care provider during your annual well check will depend  on your age, overall health, lifestyle risk factors, and family history of disease. Counseling  Your health care provider may ask you questions about your: Alcohol use. Tobacco use. Drug use. Emotional well-being. Home and relationship well-being. Sexual activity. Eating habits. History of falls. Memory and ability to understand (cognition). Work and work Statistician. Reproductive health. Screening  You may have the following tests or measurements: Height, weight, and BMI. Blood pressure. Lipid and cholesterol levels. These may be checked every 5 years, or more frequently if you are over 38 years old. Skin check. Lung cancer screening. You may have this screening every year starting at age 31 if you have a 30-pack-year history of smoking and currently smoke or have quit within the past 15 years. Fecal occult blood test (FOBT) of the stool. You may have this test every year starting at age 28. Flexible sigmoidoscopy or colonoscopy. You may have a sigmoidoscopy every 5 years or a colonoscopy every 10 years starting at age 84. Hepatitis C blood test. Hepatitis B blood test. Sexually transmitted disease (STD) testing. Diabetes screening. This is done by checking your blood sugar (glucose) after you have not eaten for a while (fasting). You may have this done every 1-3 years. Bone density scan. This is done to screen for osteoporosis. You may have this done starting at age 71. Mammogram. This may be done every 1-2 years. Talk to your health care provider about how often you should have regular mammograms. Talk with your health care provider about your test results, treatment options, and if necessary, the need for more tests. Vaccines  Your health care provider may recommend certain vaccines, such as: Influenza vaccine. This is recommended every year. Tetanus, diphtheria, and acellular pertussis (Tdap, Td) vaccine. You may need  a Td booster every 10 years. Zoster vaccine. You may need  this after age 40. Pneumococcal 13-valent conjugate (PCV13) vaccine. One dose is recommended after age 93. Pneumococcal polysaccharide (PPSV23) vaccine. One dose is recommended after age 67. Talk to your health care provider about which screenings and vaccines you need and how often you need them. This information is not intended to replace advice given to you by your health care provider. Make sure you discuss any questions you have with your health care provider. Document Released: 08/11/2015 Document Revised: 04/03/2016 Document Reviewed: 05/16/2015 Elsevier Interactive Patient Education  2017 [employer] Prevention in the Home Falls can cause injuries. They can happen to people of all ages. There are many things you can do to make your home safe and to help prevent falls. What can I do on the outside of my home? Regularly fix the edges of walkways and driveways and fix any cracks. Remove anything that might make you trip as you walk through a door, such as a raised step or threshold. Trim any bushes or trees on the path to your home. Use bright outdoor lighting. Clear any walking paths of anything that might make someone trip, such as rocks or tools. Regularly check to see if handrails are loose or broken. Make sure that both sides of any steps have handrails. Any raised decks and porches should have guardrails on the edges. Have any leaves, snow, or ice cleared regularly. Use sand or salt on walking paths during winter. Clean up any spills in your garage right away. This includes oil or grease spills. What can I do in the bathroom? Use night lights. Install grab bars by the toilet and in the tub and shower. Do not use towel bars as grab bars. Use non-skid mats or decals in the tub or shower. If you need to sit down in the shower, use a plastic, non-slip stool. Keep the floor dry. Clean up any water that spills on the floor as soon as it happens. Remove soap buildup in the tub  or shower regularly. Attach bath mats securely with double-sided non-slip rug tape. Do not have throw rugs and other things on the floor that can make you trip. What can I do in the bedroom? Use night lights. Make sure that you have a light by your bed that is easy to reach. Do not use any sheets or blankets that are too big for your bed. They should not hang down onto the floor. Have a firm chair that has side arms. You can use this for support while you get dressed. Do not have throw rugs and other things on the floor that can make you trip. What can I do in the kitchen? Clean up any spills right away. Avoid walking on wet floors. Keep items that you use a lot in easy-to-reach places. If you need to reach something above you, use a strong step stool that has a grab bar. Keep electrical cords out of the way. Do not use floor polish or wax that makes floors slippery. If you must use wax, use non-skid floor wax. Do not have throw rugs and other things on the floor that can make you trip. What can I do with my stairs? Do not leave any items on the stairs. Make sure that there are handrails on both sides of the stairs and use them. Fix handrails that are broken or loose. Make sure that handrails are as long as the stairways. Check  any carpeting to make sure that it is firmly attached to the stairs. Fix any carpet that is loose or worn. Avoid having throw rugs at the top or bottom of the stairs. If you do have throw rugs, attach them to the floor with carpet tape. Make sure that you have a light switch at the top of the stairs and the bottom of the stairs. If you do not have them, ask someone to add them for you. What else can I do to help prevent falls? Wear shoes that: Do not have high heels. Have rubber bottoms. Are comfortable and fit you well. Are closed at the toe. Do not wear sandals. If you use a stepladder: Make sure that it is fully opened. Do not climb a closed stepladder. Make  sure that both sides of the stepladder are locked into place. Ask someone to hold it for you, if possible. Clearly mark and make sure that you can see: Any grab bars or handrails. First and last steps. Where the edge of each step is. Use tools that help you move around (mobility aids) if they are needed. These include: Canes. Walkers. Scooters. Crutches. Turn on the lights when you go into a dark area. Replace any light bulbs as soon as they burn out. Set up your furniture so you have a clear path. Avoid moving your furniture around. If any of your floors are uneven, fix them. If there are any pets around you, be aware of where they are. Review your medicines with your doctor. Some medicines can make you feel dizzy. This can increase your chance of falling. Ask your doctor what other things that you can do to help prevent falls. This information is not intended to replace advice given to you by your health care provider. Make sure you discuss any questions you have with your health care provider. Document Released: 05/11/2009 Document Revised: 12/21/2015 Document Reviewed: 08/19/2014 Elsevier Interactive Patient Education  2017 [employer].

## 2021-10-16 ENCOUNTER — Other Ambulatory Visit: Payer: Self-pay

## 2021-10-16 ENCOUNTER — Other Ambulatory Visit: Payer: Medicare Other

## 2021-10-16 DIAGNOSIS — Z0000 Encounter for general adult medical examination without abnormal findings: Secondary | ICD-10-CM

## 2021-10-16 DIAGNOSIS — E785 Hyperlipidemia, unspecified: Secondary | ICD-10-CM

## 2021-10-16 DIAGNOSIS — R7303 Prediabetes: Secondary | ICD-10-CM

## 2021-10-23 ENCOUNTER — Encounter: Payer: Medicare Other | Admitting: Family Medicine

## 2021-11-13 ENCOUNTER — Ambulatory Visit (INDEPENDENT_AMBULATORY_CARE_PROVIDER_SITE_OTHER): Payer: Medicare Other | Admitting: Family Medicine

## 2021-11-13 VITALS — BP 128/72 | HR 65 | Temp 97.7°F | Resp 18 | Ht 64.0 in | Wt 159.0 lb

## 2021-11-13 DIAGNOSIS — Z0001 Encounter for general adult medical examination with abnormal findings: Secondary | ICD-10-CM | POA: Diagnosis not present

## 2021-11-13 DIAGNOSIS — Z0000 Encounter for general adult medical examination without abnormal findings: Secondary | ICD-10-CM

## 2021-11-13 DIAGNOSIS — M8580 Other specified disorders of bone density and structure, unspecified site: Secondary | ICD-10-CM | POA: Diagnosis not present

## 2021-11-13 DIAGNOSIS — E785 Hyperlipidemia, unspecified: Secondary | ICD-10-CM | POA: Diagnosis not present

## 2021-11-13 NOTE — Progress Notes (Signed)
? ?Subjective:  ? ? Patient ID: Donna Conner, female    DOB: [birth year], 71 y.o.   MRN: [id number] ? ?Patient is a 71 year old Caucasian female here today for complete physical exam.  Her mammogram was performed in December 2022 and is up-to-date.  Her colonoscopy was performed in 2021 but they found for precancerous polyps so they recommended a repeat colonoscopy in 3 years which would be due in 2024.  Due to her age, she does not require Pap smear.  She has a history of osteopenia.  She has a bone density test scheduled for later this year.  She is taking calcium and vitamin D.  She is due for the shingles vaccine.  Otherwise her immunizations are completely up-to-date.  She recently had lab work drawn at an outside lab.  Unfortunately I do not have a copy of the labs to review at this time. ?Immunization History  ?Administered Date(s) Administered  ? Hepatitis B 04/17/1995, 05/20/1995, 10/15/1995  ? Influenza, High Dose Seasonal PF 06/12/2017  ? Influenza,inj,Quad PF,6+ Mos 05/20/2013, 05/31/2014, 06/19/2015, 06/23/2018  ? Influenza-Unspecified 05/15/2016  ? Pneumococcal Conjugate-13 06/24/2016  ? Pneumococcal Polysaccharide-23 06/19/2015  ? Tdap 05/18/2011, 05/31/2014  ? Zoster, Live 06/18/2011  ? ? ?Past Medical History:  ?Diagnosis Date  ? Anemia   ? Arthritis   ? right hand  ? Cancer [facility])   ? 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 Cataract   ? bilateral repair  ? Colon polyps   ? Hyperlipidemia   ? Prediabetes   ? Trochanteric bursitis of both hips 07/2017  ? received cortisone injections bilaterally  ? Trochanteric bursitis, left hip 07/2017  ? ?Past Surgical History:  ?Procedure Laterality Date  ? CATARACT EXTRACTION W/PHACO Left 08/07/2017  ? Procedure: CATARACT EXTRACTION PHACO AND INTRAOCULAR LENS PLACEMENT (IOC);  Surgeon: Eulogio Bear, MD;  Location: [facility] ORS;  Service: Ophthalmology;  Laterality: Left;  Lot# [id number] ?[place]:   00:50.4 ?AP%:   13.4 ?CDE:    6.78  ? CATARACT EXTRACTION W/PHACO Right  08/28/2017  ? Procedure: CATARACT EXTRACTION PHACO AND INTRAOCULAR LENS PLACEMENT (IOC);  Surgeon: Eulogio Bear, MD;  Location: [facility] ORS;  Service: Ophthalmology;  Laterality: Right;  [place] 00:28.3 ?AP% 8.8 ?CDE 2.48 ?Fluid Pack lot # [id number]  ? CERVICAL CONIZATION W/BX  1978  ? CHOLECYSTECTOMY    ? COLONOSCOPY  2016  ? SKIN CANCER DESTRUCTION  07/29/06  ? basal cell  ? [place]  ? ?Current Outpatient Medications on File Prior to Visit  ?Medication Sig Dispense Refill  ? acetaminophen (TYLENOL) 500 MG tablet Take 500-1,000 mg by mouth every 6 (six) hours as needed for moderate pain or headache.     ? Calcium Carbonate-Vitamin D (CALCIUM-VITAMIN D3 PO) Take 1 tablet by mouth daily.    ? Cyanocobalamin (VITAMIN B 12 PO) Take by mouth.    ? ?No current facility-administered medications on file prior to visit.  ? ?No Known Allergies ?Social History  ? ?Socioeconomic History  ? Marital status: Married  ?  Spouse name: Jenny Reichmann  ? Number of children: 1  ? Years of education: Not on file  ? Highest education level: Not on file  ?Occupational History  ? Not on file  ?Tobacco Use  ? Smoking status: Former  ?  Types: Cigarettes  ?  Quit date: 10/26/1993  ?  Years since quitting: 28.0  ? Smokeless tobacco: Never  ?Vaping Use  ? Vaping Use: Never used  ?Substance and  Sexual Activity  ? Alcohol use: No  ? Drug use: No  ? Sexual activity: Yes  ?  Comment: married to [place], retired  ?Other Topics Concern  ? Not on file  ?Social History Narrative  ? 1 son. 2 step sons  ? 6 grandchildren  ? 4 great grand children.  ? ?Social Determinants of Health  ? ?Financial Resource Strain: Low Risk   ? Difficulty of Paying Living Expenses: Not hard at all  ?Food Insecurity: No Food Insecurity  ? Worried About Charity fundraiser in the Last Year: Never true  ? Ran Out of Food in the Last Year: Never true  ?Transportation Needs: No Transportation Needs  ? Lack of Transportation (Medical): No  ? Lack of Transportation  (Non-Medical): No  ?Physical Activity: Sufficiently Active  ? Days of Exercise per Week: 4 days  ? Minutes of Exercise per Session: 60 min  ?Stress: No Stress Concern Present  ? Feeling of Stress : Not at all  ?Social Connections: Socially Integrated  ? Frequency of Communication with Friends and Family: More than three times a week  ? Frequency of Social Gatherings with Friends and Family: More than three times a week  ? Attends Religious Services: More than 4 times per year  ? Active Member of Clubs or Organizations: Yes  ? Attends Archivist Meetings: More than 4 times per year  ? Marital Status: Married  ?Intimate Partner Violence: Not At Risk  ? Fear of Current or Ex-Partner: No  ? Emotionally Abused: No  ? Physically Abused: No  ? Sexually Abused: No  ? ?Family History  ?Problem Relation Age of Onset  ? Colon cancer Neg Hx   ? Esophageal cancer Neg Hx   ? Rectal cancer Neg Hx   ? Stomach cancer Neg Hx   ? Colon polyps Neg Hx   ? ? ?Review of Systems  ?All other systems reviewed and are negative. ? ? ?   ?Objective:  ? Physical Exam ?Vitals reviewed.  ?Constitutional:   ?   General: She is not in acute distress. ?   Appearance: She is well-developed. She is not diaphoretic.  ?HENT:  ?   Head: Normocephalic and atraumatic.  ?   Right Ear: External ear normal.  ?   Left Ear: External ear normal.  ?   Nose: Nose normal.  ?   Mouth/Throat:  ?   Pharynx: No oropharyngeal exudate.  ?Eyes:  ?   General: No scleral icterus.    ?   Right eye: No discharge.     ?   Left eye: No discharge.  ?   Conjunctiva/sclera: Conjunctivae normal.  ?   Pupils: Pupils are equal, round, and reactive to light.  ?Neck:  ?   Thyroid: No thyromegaly.  ?   Vascular: No JVD.  ?   Trachea: No tracheal deviation.  ?Cardiovascular:  ?   Rate and Rhythm: Normal rate and regular rhythm.  ?   Heart sounds: Normal heart sounds. No murmur heard. ?  No friction rub. No gallop.  ?Pulmonary:  ?   Effort: Pulmonary effort is normal. No  respiratory distress.  ?   Breath sounds: Normal breath sounds. No stridor. No wheezing or rales.  ?Chest:  ?   Chest wall: No tenderness.  ?Abdominal:  ?   General: Bowel sounds are normal. There is no distension.  ?   Palpations: Abdomen is soft. There is no mass.  ?   Tenderness: There  is no abdominal tenderness. There is no guarding or rebound.  ?Musculoskeletal:     ?   General: No tenderness. Normal range of motion.  ?   Cervical back: Normal range of motion and neck supple.  ?Lymphadenopathy:  ?   Cervical: No cervical adenopathy.  ?Skin: ?   General: Skin is warm.  ?   Coloration: Skin is not pale.  ?   Findings: No erythema or rash.  ?Neurological:  ?   Mental Status: She is alert and oriented to person, place, and time.  ?   Cranial Nerves: No cranial nerve deficit.  ?   Motor: No abnormal muscle tone.  ?   Coordination: Coordination normal.  ?   Deep Tendon Reflexes: Reflexes are normal and symmetric.  ?Psychiatric:     ?   Behavior: Behavior normal.     ?   Thought Content: Thought content normal.     ?   Judgment: Judgment normal.  ? ? ? ? ? ?   ?Assessment & Plan:  ?General medical exam ? ?Hyperlipidemia, unspecified hyperlipidemia type ? ?Osteopenia, unspecified location ? ?Encounter for Medicare annual wellness exam ?Her physical exam today is completely normal.  Her blood pressure is excellent.  I recommended the shingles vaccine at her local pharmacy.  Her mammogram and bone density test have already been scheduled.  Her colonoscopy is not due until next year.  Her Pap smear is not required.  I will have the patient's sign a release of information form so that I can get her labs from her recent lab work to review.  I did recommend at least trying fish oil 2000 mg a day for hyperlipidemia based on her previous lab results.  Await the results of her lab testing. ?

## 2021-12-18 ENCOUNTER — Telehealth: Payer: Self-pay | Admitting: Family Medicine

## 2021-12-18 NOTE — Telephone Encounter (Signed)
Patient called to request adjustment in codes for AWV, DOS 10-12-2021, and CPE, DOS 11/13/2021. Patient stated she received a bill for the AWV and was told by insurance company she reached her annual allowance for a CPE for DOS 11/13/21.  Please adjust and advise patient at [phone]

## 2022-01-22 NOTE — Telephone Encounter (Signed)
I have resubmitted this and advised patient that she can disregard statement. Patient verbalized understanding.

## 2022-02-28 ENCOUNTER — Ambulatory Visit
Admission: RE | Admit: 2022-02-28 | Discharge: 2022-02-28 | Disposition: A | Discharge status: Home / Self Care | Payer: Medicare Other | Source: Ambulatory Visit | Attending: Family Medicine | Admitting: Family Medicine

## 2022-02-28 DIAGNOSIS — M8580 Other specified disorders of bone density and structure, unspecified site: Secondary | ICD-10-CM

## 2022-03-01 ENCOUNTER — Telehealth: Payer: Self-pay

## 2022-03-01 NOTE — Telephone Encounter (Signed)
Called pt to advised of bone density results. Pt asks if she would be charge for me calling her. I advised her, "No that I am just calling with lab results." Pt states that the last time I spoke with her, there was a charge sent to her insurance. I looked back in the patient chart and she is discussing when I called her for her annual wellness visit. I apologized to the patient and advised her that she should not received a charge because it is part of her Medicare benefits. Pt states it has been corrected but states she will not do a wellness visit again. I apologized again to the patient for the confusion.

## 2022-05-31 ENCOUNTER — Other Ambulatory Visit: Payer: Self-pay | Admitting: Family Medicine

## 2022-05-31 DIAGNOSIS — Z1231 Encounter for screening mammogram for malignant neoplasm of breast: Secondary | ICD-10-CM

## 2022-07-31 ENCOUNTER — Ambulatory Visit
Admission: RE | Admit: 2022-07-31 | Discharge: 2022-07-31 | Disposition: A | Discharge status: Home / Self Care | Payer: Medicare Other | Source: Ambulatory Visit | Attending: Family Medicine | Admitting: Family Medicine

## 2022-07-31 DIAGNOSIS — Z1231 Encounter for screening mammogram for malignant neoplasm of breast: Secondary | ICD-10-CM

## 2022-08-09 ENCOUNTER — Encounter: Payer: Self-pay | Admitting: Family Medicine

## 2022-09-18 ENCOUNTER — Encounter: Payer: Self-pay | Admitting: Gastroenterology

## 2022-09-26 ENCOUNTER — Encounter: Payer: Self-pay | Admitting: Family Medicine

## 2022-09-26 ENCOUNTER — Telehealth: Payer: Self-pay | Admitting: Family Medicine

## 2022-09-26 NOTE — Telephone Encounter (Signed)
  Contacted Donna Conner to schedule their annual wellness visit. Patient declined to schedule AWV at this time.  Declined 08/02/22 when patient cancelled   Thank you,  [name],  [place] Direct Dial ??[phone]

## 2023-01-16 ENCOUNTER — Telehealth: Payer: Medicare Other | Admitting: Physician Assistant

## 2023-01-16 DIAGNOSIS — L255 Unspecified contact dermatitis due to plants, except food: Secondary | ICD-10-CM | POA: Diagnosis not present

## 2023-01-16 MED ORDER — PREDNISONE 10 MG (21) PO TBPK: ORAL_TABLET | ORAL | 0 refills | Status: DC

## 2023-01-16 NOTE — Progress Notes (Signed)
Virtual Visit Consent   Donna Conner, you are scheduled for a virtual visit with a [facility] provider today. Just as with appointments in the office, your consent must be obtained to participate. Your consent will be active for this visit and any virtual visit you may have with one of our providers in the next 365 days. If you have a MyChart account, a copy of this consent can be sent to you electronically.  As this is a virtual visit, video technology does not allow for your provider to perform a traditional examination. This may limit your provider's ability to fully assess your condition. If your provider identifies any concerns that need to be evaluated in person or the need to arrange testing (such as labs, EKG, etc.), we will make arrangements to do so. Although advances in technology are sophisticated, we cannot ensure that it will always work on either your end or our end. If the connection with a video visit is poor, the visit may have to be switched to a telephone visit. With either a video or telephone visit, we are not always able to ensure that we have a secure connection.  By engaging in this virtual visit, you consent to the provision of healthcare and authorize for your insurance to be billed (if applicable) for the services provided during this visit. Depending on your insurance coverage, you may receive a charge related to this service.  I need to obtain your verbal consent now. Are you willing to proceed with your visit today? Kahliya Campuzano has provided verbal consent on 01/16/2023 for a virtual visit (video or telephone). Margaretann Loveless, PA-C  Date: 01/16/2023 12:03 PM  Virtual Visit via Video Note   I, Margaretann Loveless, connected with  Donna Conner  ([id number], October 04, 1950) on 01/16/23 at 12:00 PM EDT by a video-enabled telemedicine application and verified that I am speaking with the correct person using two identifiers.  Location: Patient: Virtual Visit Location Patient:  Home Provider: Virtual Visit Location Provider: Home Office   I discussed the limitations of evaluation and management by telemedicine and the availability of in person appointments. The patient expressed understanding and agreed to proceed.    History of Present Illness: Donna Conner is a 72 y.o. who identifies as a female who was assigned female at birth, and is being seen today for poison oak/ivy rash.  HPI: Poison [name] This is a recurrent problem. The current episode started 1 to 4 weeks ago (had outbreak over 2 weeks ago and was seen at [facility], given prednisone 40mg  x 5 days. Has not had full resolution, or possible re-exposure.). The problem has been gradually worsening since onset. The rash is diffuse (popliteal area of right knee, right lower leg, left arm, right arm). The rash is characterized by blistering, itchiness and redness. She was exposed to plant contact. Pertinent negatives include no congestion, cough, fatigue, fever, joint pain or shortness of breath. Past treatments include topical steroids, oral steroids, cold compress, antihistamine and anti-itch cream. The treatment provided no relief.     Problems:  Patient Active Problem List   Diagnosis Date Noted   Trochanteric bursitis of both hips 08/04/2017   Prediabetes    Colon polyps    Hyperlipidemia     Allergies: No Known Allergies Medications:  Current Outpatient Medications:    acetaminophen (TYLENOL) 500 MG tablet, Take 500-1,000 mg by mouth every 6 (six) hours as needed for moderate pain or headache. , Disp: , Rfl:  Calcium Carbonate-Vitamin D (CALCIUM-VITAMIN D3 PO), Take 1 tablet by mouth daily., Disp: , Rfl:    Cyanocobalamin (VITAMIN B 12 PO), Take by mouth., Disp: , Rfl:    predniSONE (STERAPRED UNI-PAK 21 TAB) 10 MG (21) TBPK tablet, 6 day taper; take as directed on package instructions, Disp: 21 tablet, Rfl: 0  Observations/Objective: Patient is well-developed, well-nourished in no acute  distress.  Resting comfortably at home.  Head is normocephalic, atraumatic.  No labored breathing.  Speech is clear and coherent with logical content.  Patient is alert and oriented at baseline.    Assessment and Plan: 1. Dermatitis due to plants, including poison ivy, sumac, and oak - predniSONE (STERAPRED UNI-PAK 21 TAB) 10 MG (21) TBPK tablet; 6 day taper; take as directed on package instructions  Dispense: 21 tablet; Refill: 0  - Plant exposure (poison ivy/poison oak/poison sumac) with rash - Will prescribe Prednisone 6 day taper - May use topical Hydrocortisone cream, benadryl cream, and/or calamine lotion for itching - Cool compresses - Luke warm to cool showers - Seek in person evaluation if rash continues to spread or if any appear to become infected   Follow Up Instructions: I discussed the assessment and treatment plan with the patient. The patient was provided an opportunity to ask questions and all were answered. The patient agreed with the plan and demonstrated an understanding of the instructions.  A copy of instructions were sent to the patient via MyChart unless otherwise noted below.    The patient was advised to call back or seek an in-person evaluation if the symptoms worsen or if the condition fails to improve as anticipated.  Time:  I spent 8 minutes with the patient via telehealth technology discussing the above problems/concerns.    Margaretann Loveless, PA-C

## 2023-01-16 NOTE — Patient Instructions (Signed)
Donna Conner, thank you for joining Margaretann Loveless, PA-C for today's virtual visit.  While this provider is not your primary care provider (PCP), if your PCP is located in our provider database this encounter information will be shared with them immediately following your visit.   A [facility] MyChart account gives you access to today's visit and all your visits, tests, and labs performed at [facility] " click here if you don't have a [facility] MyChart account or go to [email or web address]  Consent: (Patient) Donna Conner provided verbal consent for this virtual visit at the beginning of the encounter.  Current Medications:  Current Outpatient Medications:    acetaminophen (TYLENOL) 500 MG tablet, Take 500-1,000 mg by mouth every 6 (six) hours as needed for moderate pain or headache. , Disp: , Rfl:    Calcium Carbonate-Vitamin D (CALCIUM-VITAMIN D3 PO), Take 1 tablet by mouth daily., Disp: , Rfl:    Cyanocobalamin (VITAMIN B 12 PO), Take by mouth., Disp: , Rfl:    predniSONE (STERAPRED UNI-PAK 21 TAB) 10 MG (21) TBPK tablet, 6 day taper; take as directed on package instructions, Disp: 21 tablet, Rfl: 0   Medications ordered in this encounter:  Meds ordered this encounter  Medications   predniSONE (STERAPRED UNI-PAK 21 TAB) 10 MG (21) TBPK tablet    Sig: 6 day taper; take as directed on package instructions    Dispense:  21 tablet    Refill:  0    Order Specific Question:   Supervising Provider    Answer:   Merrilee Jansky [id number]     *If you need refills on other medications prior to your next appointment, please contact your pharmacy*  Follow-Up: Call back or seek an in-person evaluation if the symptoms worsen or if the condition fails to improve as anticipated.   Virtual Care [phone]  Other Instructions Poison Oak Dermatitis  Poison oak dermatitis is inflammation of the skin that is caused by contact with the chemicals in the  leaves of the poison oak (Toxicodendron) plant. The skin reaction often includes redness, swelling, blisters, and extreme itching. What are the causes? This condition is caused by a specific chemical (urushiol) that is found in the sap of the poison oak plant. This chemical is sticky and can be easily spread to people, animals, and objects. You can get poison oak dermatitis by: Having direct contact with a poison oak plant. Touching animals, other people, or objects that have come in contact with poison oak and have the chemical on them. What increases the risk? This condition is more likely to develop in people who: Are outdoors often in wooded or [place] areas. Go outdoors without wearing protective clothing, such as closed shoes, long pants, and a long-sleeved shirt. What are the signs or symptoms? Symptoms of this condition include: Redness of the skin. Extreme itching. A rash that often includes bumps and blisters. The rash usually appears 48 hours after exposure if you have been exposed before. If this is the first time you have been exposed, the rash may not appear until a week after exposure. Swelling. This may occur if the reaction is more severe. Symptoms usually last for 1-2 weeks. However, the first time you develop this condition, symptoms may last 3-4 weeks. How is this diagnosed? This condition may be diagnosed based on your symptoms and a physical exam. Your health care provider may also ask you about any recent outdoor activity. How is this treated? Treatment for this condition  will vary depending on how severe it is. Treatment may include: Hydrocortisone creams or calamine lotions to relieve itching. Oatmeal baths to soothe the skin. Over-the-counter antihistamine medicines to help reduce itching. Steroid medicine taken by mouth (orally) for more severe reactions. Follow these instructions at home: Medicines Take or apply over-the-counter and prescription medicines only as  told by your health care provider. Use hydrocortisone creams or calamine lotion as needed to soothe the skin and relieve itching. General instructions Do not scratch or rub your skin. Apply a cold, wet cloth (cold compress) to the affected areas or take baths in cool water. This will help with itching. Avoid hot baths and showers. Take oatmeal baths as needed. Use colloidal oatmeal. You can get this at your local pharmacy or grocery store. Follow the instructions on the packaging. Wash clothes, bedsheets, towels, and blankets that you wore or came in contact with between your exposure to the plant and the appearance of your rash. The oils can remain on these items and continue to cause new exposure. Check the affected area every day for signs of infection. Check for: More redness, swelling, or pain. Fluid or blood. Warmth. Pus or a bad smell. Keep all follow-up visits. Your health care provider may want to see how your skin is progressing with treatment. How is this prevented?  Learn to identify the poison oak plant and avoid contact with the plant. This plant can be recognized by the number of leaves. Generally, poison oak has three leaves with flowering branches on a single stem. The leaves are often a bit fuzzy and have a toothlike edge. If you have been exposed to poison oak, thoroughly wash your skin with soap and water right away. You have about 30 minutes to remove the plant resin before it will cause the rash. Be sure to wash under your fingernails because any plant resin there will continue to spread the rash. When hiking or camping, wear clothes that will help you avoid exposure on the skin. This includes long pants, a long-sleeved shirt, long socks, and hiking boots. You can also apply preventive lotion to your skin to help limit exposure. If you suspect that your clothes or outdoor gear came in contact with poison oak, rinse them off outside with a garden hose before bringing them  inside your house. When doing yard work or gardening, wear gloves, long sleeves, long pants, and boots. Wash your garden tools and gloves if they come in contact with poison oak. If you suspect that your pet has come into contact with poison oak, wash them with pet shampoo and water. Make sure you wear gloves while washing your pet. Do not burn poison oak plants. This can release the chemical from the plant into the air and may cause a reaction on the skin or eyes, or in the lungs from breathing in the smoke. Contact a health care provider if: You have open sores in the rash area. You have any signs of infection. You have redness that spreads beyond the rash area. You have a fever. You have a rash over a large area of your body. You have a rash on your eyes, mouth, or genitals. You have a rash that does not improve after a few weeks. Get help right away if: Your face swells or your eyes swell shut. You have trouble breathing. You have trouble swallowing. These symptoms may be an emergency. Get help right away. Call 911. Do not wait to see if the symptoms will  go away. Do not drive yourself to the hospital. This information is not intended to replace advice given to you by your health care provider. Make sure you discuss any questions you have with your health care provider. Document Revised: 01/23/2022 Document Reviewed: 12/13/2021 Elsevier Patient Education  2024 Elsevier Inc.    If you have been instructed to have an in-person evaluation today at a local Urgent Care facility, please use the link below. It will take you to a list of all of our available [facility], including address, phone number and hours of operation. Please do not delay care.  [facility]  If you or a family member do not have a primary care provider, use the link below to schedule a visit and establish care. When you choose a [facility] primary care physician or advanced practice  provider, you gain a long-term partner in health. Find a Primary Care Provider  Learn more about [facility]'s in-office and virtual care options: [facility] - Get Care Now

## 2023-03-13 ENCOUNTER — Telehealth: Payer: Medicare Other | Admitting: Nurse Practitioner

## 2023-03-13 DIAGNOSIS — L237 Allergic contact dermatitis due to plants, except food: Secondary | ICD-10-CM

## 2023-03-13 MED ORDER — MOMETASONE FUROATE 0.1 % EX CREA: TOPICAL_CREAM | CUTANEOUS | 1 refills | Status: AC

## 2023-03-13 MED ORDER — PREDNISONE 10 MG (21) PO TBPK: ORAL_TABLET | ORAL | 0 refills | Status: DC

## 2023-03-13 NOTE — Progress Notes (Signed)
Virtual Visit Consent   Donna Conner, you are scheduled for a virtual visit with a [facility] provider today. Just as with appointments in the office, your consent must be obtained to participate. Your consent will be active for this visit and any virtual visit you may have with one of our providers in the next 365 days. If you have a MyChart account, a copy of this consent can be sent to you electronically.  As this is a virtual visit, video technology does not allow for your provider to perform a traditional examination. This may limit your provider's ability to fully assess your condition. If your provider identifies any concerns that need to be evaluated in person or the need to arrange testing (such as labs, EKG, etc.), we will make arrangements to do so. Although advances in technology are sophisticated, we cannot ensure that it will always work on either your end or our end. If the connection with a video visit is poor, the visit may have to be switched to a telephone visit. With either a video or telephone visit, we are not always able to ensure that we have a secure connection.  By engaging in this virtual visit, you consent to the provision of healthcare and authorize for your insurance to be billed (if applicable) for the services provided during this visit. Depending on your insurance coverage, you may receive a charge related to this service.  I need to obtain your verbal consent now. Are you willing to proceed with your visit today? Donna Conner has provided verbal consent on 03/13/2023 for a virtual visit (video or telephone). Donna Simas, FNP  Date: 03/13/2023 8:42 AM  Virtual Visit via Video Note   I, Donna Conner, connected with  Donna Conner  ([id number], 26-Oct-1950) on 03/13/23 at  8:45 AM EDT by a video-enabled telemedicine application and verified that I am speaking with the correct person using two identifiers.  Location: Patient: Virtual Visit Location Patient: Home Provider:  Virtual Visit Location Provider: Home Office   I discussed the limitations of evaluation and management by telemedicine and the availability of in person appointments. The patient expressed understanding and agreed to proceed.    History of Present Illness:  Donna Conner is a 72 y.o. who identifies as a female who was assigned female at birth, and is being seen today for poison ivy.   She was exposed to poison ivy and has a history of allergic response to poison ivy exposure.   Today she has the worst rash on her buttock, started on her arm after she was outdoor, also has a spot on her knee  Areas are starting to blister.     Problems:  Patient Active Problem List   Diagnosis Date Noted   Trochanteric bursitis of both hips 08/04/2017   Prediabetes    Colon polyps    Hyperlipidemia     Allergies: No Known Allergies Medications:  Current Outpatient Medications:    acetaminophen (TYLENOL) 500 MG tablet, Take 500-1,000 mg by mouth every 6 (six) hours as needed for moderate pain or headache. , Disp: , Rfl:    Calcium Carbonate-Vitamin D (CALCIUM-VITAMIN D3 PO), Take 1 tablet by mouth daily., Disp: , Rfl:    Cyanocobalamin (VITAMIN B 12 PO), Take by mouth., Disp: , Rfl:    predniSONE (STERAPRED UNI-PAK 21 TAB) 10 MG (21) TBPK tablet, 6 day taper; take as directed on package instructions, Disp: 21 tablet, Rfl: 0  Observations/Objective: Patient is well-developed, well-nourished in no acute  distress.  Resting comfortably  at home.  Head is normocephalic, atraumatic.  No labored breathing.  Speech is clear and coherent with logical content.  Patient is alert and oriented at baseline.  Blistering rash to arm and knee without signs of infection   Assessment and Plan:  1. Poison ivy dermatitis   Meds ordered this encounter  Medications   predniSONE (STERAPRED UNI-PAK 21 TAB) 10 MG (21) TBPK tablet    Sig: Take 6 tablets on day one, 5 on day two, 4 on day three, 3 on day four, 2 on  day five, and 1 on day six. Take with food.    Dispense:  21 tablet    Refill:  0   mometasone (ELOCON) 0.1 % cream    Sig: Apply once daily as needed to rash. Do not use for more than 14 days consistently    Dispense:  15 g    Refill:  1       May also use over the counter antihistamine (zyrtec or claritin) for added relief of itching   Avoid NSAIDs while on prednisone  May continue tylenol as needed     Follow Up Instructions: I discussed the assessment and treatment plan with the patient. The patient was provided an opportunity to ask questions and all were answered. The patient agreed with the plan and demonstrated an understanding of the instructions.  A copy of instructions were sent to the patient via MyChart unless otherwise noted below.    The patient was advised to call back or seek an in-person evaluation if the symptoms worsen or if the condition fails to improve as anticipated.  Time:  I spent 10 minutes with the patient via telehealth technology discussing the above problems/concerns.    Donna Simas, FNP

## 2023-03-21 ENCOUNTER — Encounter: Payer: Self-pay | Admitting: Nurse Practitioner

## 2023-03-21 DIAGNOSIS — L237 Allergic contact dermatitis due to plants, except food: Secondary | ICD-10-CM

## 2023-03-21 MED ORDER — PREDNISONE 10 MG (21) PO TBPK: ORAL_TABLET | ORAL | 0 refills | Status: DC

## 2023-06-10 ENCOUNTER — Encounter: Payer: Self-pay | Admitting: Family Medicine

## 2023-06-10 NOTE — Telephone Encounter (Signed)
Care team updated and letter sent for eye exam notes.

## 2023-06-18 ENCOUNTER — Other Ambulatory Visit: Payer: Self-pay | Admitting: Family Medicine

## 2023-06-18 DIAGNOSIS — Z1231 Encounter for screening mammogram for malignant neoplasm of breast: Secondary | ICD-10-CM

## 2023-08-04 ENCOUNTER — Ambulatory Visit: Payer: Medicare Other

## 2023-08-20 ENCOUNTER — Ambulatory Visit
Admission: RE | Admit: 2023-08-20 | Discharge: 2023-08-20 | Disposition: A | Discharge status: Home / Self Care | Payer: Medicare Other | Source: Ambulatory Visit | Attending: Family Medicine | Admitting: Family Medicine

## 2023-08-20 DIAGNOSIS — Z1231 Encounter for screening mammogram for malignant neoplasm of breast: Secondary | ICD-10-CM

## 2023-09-05 ENCOUNTER — Encounter: Payer: Self-pay | Admitting: Family Medicine

## 2023-09-05 LAB — LAB REPORT - SCANNED
A1c: 5.9
EGFR: 69

## 2023-09-12 ENCOUNTER — Encounter: Payer: Medicare Other | Admitting: Family Medicine

## 2023-09-15 ENCOUNTER — Telehealth: Payer: Self-pay

## 2023-09-15 NOTE — Telephone Encounter (Signed)
 Copied from CRM [id number]. Topic: General - Other >> Sep 15, 2023  4:32 PM Antony Haste wrote: Reason for CRM: PT is wanting to determine if she will need to complete labwork at her upcoming appointment next Monday at 02/24, she stated [facility] faxed over a copy of her recent bloodwork results and would like to know if this will be sufficient?  Callback #:[phone]

## 2023-09-22 ENCOUNTER — Ambulatory Visit (INDEPENDENT_AMBULATORY_CARE_PROVIDER_SITE_OTHER): Payer: Medicare Other | Admitting: Family Medicine

## 2023-09-22 ENCOUNTER — Encounter: Payer: Self-pay | Admitting: Family Medicine

## 2023-09-22 VITALS — BP 136/72 | HR 72 | Temp 97.9°F | Ht 64.0 in | Wt 154.9 lb

## 2023-09-22 DIAGNOSIS — Z1211 Encounter for screening for malignant neoplasm of colon: Secondary | ICD-10-CM | POA: Diagnosis not present

## 2023-09-22 DIAGNOSIS — M8580 Other specified disorders of bone density and structure, unspecified site: Secondary | ICD-10-CM | POA: Diagnosis not present

## 2023-09-22 DIAGNOSIS — E785 Hyperlipidemia, unspecified: Secondary | ICD-10-CM | POA: Diagnosis not present

## 2023-09-22 DIAGNOSIS — Z0001 Encounter for general adult medical examination with abnormal findings: Secondary | ICD-10-CM | POA: Diagnosis not present

## 2023-09-22 DIAGNOSIS — Z0000 Encounter for general adult medical examination without abnormal findings: Secondary | ICD-10-CM

## 2023-09-22 NOTE — Progress Notes (Signed)
 Subjective:    Patient ID: Donna Conner, female    DOB: 05-18-1951, 73 y.o.   MRN: [id number]  Patient is a 73 year old Caucasian female here today for complete physical exam.  Mammogram was performed 07/2023 and is utd.  Last colonoscopy was 2021 and is due as previous recommendation was to repeat colonoscopy in 3 years.  Bone density in 2023 showed osteopenia with t score of -2.0.  Due to age, pap smear is not recommended.  Patient had lab work at an outside facility.  CBC was completely normal.  CMP was significant for a blood sugar of 118.  Hemoglobin A1c was 5.9.  Fasting lipid panel was significant for an LDL cholesterol of 117.  However HDL cholesterol is greater than 60. Immunization History  Administered Date(s) Administered   Hepatitis B 04/17/1995, 05/20/1995, 10/15/1995   Influenza, High Dose Seasonal PF 06/12/2017   Influenza,inj,Quad PF,6+ Mos 05/20/2013, 05/31/2014, 06/19/2015, 06/23/2018   Influenza-Unspecified 05/15/2016, 04/14/2022   PFIZER(Purple Top)SARS-COV-2 Vaccination 08/18/2019, 09/08/2019, 12/20/2020   Pfizer Covid-19 Vaccine Bivalent Booster 97yrs & up 06/11/2021   Pneumococcal Conjugate-13 06/24/2016   Pneumococcal Polysaccharide-23 06/19/2015   Tdap 05/18/2011, 05/31/2014   Zoster Recombinant(Shingrix) 04/14/2022   Zoster, Live 06/18/2011    Past Medical History:  Diagnosis Date   Anemia    Arthritis    right hand   Cancer (HCC)    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   Cataract    bilateral repair   Colon polyps    Hyperlipidemia    Prediabetes    Trochanteric bursitis of both hips 07/2017   received cortisone injections bilaterally   Trochanteric bursitis, left hip 07/2017   Past Surgical History:  Procedure Laterality Date   CATARACT EXTRACTION W/PHACO Left 08/07/2017   Procedure: CATARACT EXTRACTION PHACO AND INTRAOCULAR LENS PLACEMENT (IOC);  Surgeon: Nevada Crane, MD;  Location: [facility] ORS;  Service: Ophthalmology;  Laterality: Left;   Lot# [id number] [place]:   00:50.4 AP%:   13.4 CDE:    6.78   CATARACT EXTRACTION W/PHACO Right 08/28/2017   Procedure: CATARACT EXTRACTION PHACO AND INTRAOCULAR LENS PLACEMENT (IOC);  Surgeon: Nevada Crane, MD;  Location: [facility] ORS;  Service: Ophthalmology;  Laterality: Right;  [place] 00:28.3 AP% 8.8 CDE 2.48 Fluid Pack lot # [id number]   CERVICAL CONIZATION W/BX  1978   CHOLECYSTECTOMY     COLONOSCOPY  2016   SKIN CANCER DESTRUCTION  07/29/06   basal cell   TONSILLECTOMY AND ADENOIDECTOMY  1967   Current Outpatient Medications on File Prior to Visit  Medication Sig Dispense Refill   acetaminophen (TYLENOL) 500 MG tablet Take 500-1,000 mg by mouth every 6 (six) hours as needed for moderate pain or headache.      Calcium Carbonate-Vitamin D (CALCIUM-VITAMIN D3 PO) Take 1 tablet by mouth daily.     Cyanocobalamin (VITAMIN B 12 PO) Take by mouth.     mometasone (ELOCON) 0.1 % cream Apply once daily as needed to rash. Do not use for more than 14 days consistently 15 g 1   No current facility-administered medications on file prior to visit.   No Known Allergies Social History   Socioeconomic History   Marital status: Married    Spouse name: John   Number of children: 1   Years of education: Not on file   Highest education level: Associate degree: occupational, Scientist, product/process development, or vocational program  Occupational History   Not on file  Tobacco Use   Smoking status: Former  Current packs/day: 0.00    Types: Cigarettes    Quit date: 10/26/1993    Years since quitting: 29.9   Smokeless tobacco: Never  Vaping Use   Vaping status: Never Used  Substance and Sexual Activity   Alcohol use: No   Drug use: No   Sexual activity: Yes    Comment: married to Jonny Ruiz, retired  Other Topics Concern   Not on file  Social History Narrative   1 son. 2 step sons   6 grandchildren   4 great grand children.   Social Drivers of Corporate investment banker Strain: Low Risk  (09/18/2023)   Overall  Financial Resource Strain (CARDIA)    Difficulty of Paying Living Expenses: Not hard at all  Food Insecurity: No Food Insecurity (09/18/2023)   Hunger Vital Sign    Worried About Running Out of Food in the Last Year: Never true    Ran Out of Food in the Last Year: Never true  Transportation Needs: No Transportation Needs (09/18/2023)   PRAPARE - Administrator, Civil Service (Medical): No    Lack of Transportation (Non-Medical): No  Physical Activity: Insufficiently Active (09/18/2023)   Exercise Vital Sign    Days of Exercise per Week: 3 days    Minutes of Exercise per Session: 30 min  Stress: No Stress Concern Present (09/18/2023)   [employer] of Occupational Health - Occupational Stress Questionnaire    Feeling of Stress : Only a little  Social Connections: Socially Integrated (09/18/2023)   Social Connection and Isolation Panel [NHANES]    Frequency of Communication with Friends and Family: More than three times a week    Frequency of Social Gatherings with Friends and Family: More than three times a week    Attends Religious Services: More than 4 times per year    Active Member of [employer] or Organizations: Yes    Attends Engineer, structural: More than 4 times per year    Marital Status: Married  Catering manager Violence: Not At Risk (09/20/2021)   Humiliation, Afraid, Rape, and Kick questionnaire    Fear of Current or Ex-Partner: No    Emotionally Abused: No    Physically Abused: No    Sexually Abused: No   Family History  Problem Relation Age of Onset   Colon cancer Neg Hx    Esophageal cancer Neg Hx    Rectal cancer Neg Hx    Stomach cancer Neg Hx    Colon polyps Neg Hx     Review of Systems  All other systems reviewed and are negative.      Objective:   Physical Exam Vitals reviewed.  Constitutional:      General: She is not in acute distress.    Appearance: She is well-developed. She is not diaphoretic.  HENT:     Head: Normocephalic  and atraumatic.     Right Ear: External ear normal.     Left Ear: External ear normal.     Nose: Nose normal.     Mouth/Throat:     Pharynx: No oropharyngeal exudate.  Eyes:     General: No scleral icterus.       Right eye: No discharge.        Left eye: No discharge.     Conjunctiva/sclera: Conjunctivae normal.     Pupils: Pupils are equal, round, and reactive to light.  Neck:     Thyroid: No thyromegaly.     Vascular: No JVD.  Trachea: No tracheal deviation.  Cardiovascular:     Rate and Rhythm: Normal rate and regular rhythm.     Heart sounds: Normal heart sounds. No murmur heard.    No friction rub. No gallop.  Pulmonary:     Effort: Pulmonary effort is normal. No respiratory distress.     Breath sounds: Normal breath sounds. No stridor. No wheezing or rales.  Chest:     Chest wall: No tenderness.  Abdominal:     General: Bowel sounds are normal. There is no distension.     Palpations: Abdomen is soft. There is no mass.     Tenderness: There is no abdominal tenderness. There is no guarding or rebound.  Musculoskeletal:        General: No tenderness. Normal range of motion.     Cervical back: Normal range of motion and neck supple.  Lymphadenopathy:     Cervical: No cervical adenopathy.  Skin:    General: Skin is warm.     Coloration: Skin is not pale.     Findings: No erythema or rash.  Neurological:     Mental Status: She is alert and oriented to person, place, and time.     Cranial Nerves: No cranial nerve deficit.     Motor: No abnormal muscle tone.     Coordination: Coordination normal.     Deep Tendon Reflexes: Reflexes are normal and symmetric.  Psychiatric:        Behavior: Behavior normal.        Thought Content: Thought content normal.        Judgment: Judgment normal.           Assessment & Plan:  Hyperlipidemia, unspecified hyperlipidemia type - Plan: CBC with Differential/Platelet, COMPLETE METABOLIC PANEL WITH GFR, Lipid panel  General  medical exam - Plan: CBC with Differential/Platelet, COMPLETE METABOLIC PANEL WITH GFR, Lipid panel  Osteopenia, unspecified location  Colon cancer screening - Plan: Ambulatory referral to Gastroenterology  Her physical exam today is completely normal.  Her blood pressure is excellent.  Patient's vaccinations are up-to-date except for a tetanus shot as well as the RSV vaccine.  We discussed these at the present time and the patient defers them for now.  I will schedule the patient for colonoscopy.  Mammogram is up-to-date.  Repeat a bone density test next year.  Encouraged the patient to take 1200 mg a day of calcium and 1000 units a day of vitamin D.  Also recommended a low carbohydrate diet.

## 2023-09-23 ENCOUNTER — Encounter (INDEPENDENT_AMBULATORY_CARE_PROVIDER_SITE_OTHER): Payer: Self-pay | Admitting: *Deleted

## 2023-09-28 ENCOUNTER — Encounter: Payer: Self-pay | Admitting: Family Medicine

## 2023-10-30 ENCOUNTER — Telehealth: Payer: Self-pay

## 2023-10-30 NOTE — Telephone Encounter (Signed)
 Dr. Tomasa Rand,   This is a prev pt of Dr. Christain [place]. She has a PV scheduled for tomorrow. Per her last colon report her prep was said to be good but per recall assessment it was listed as fair and the need for a 2 day prep. There is nothing further advising on this in the colonoscopy report from 10/2019. She called in with n/v using the miralax prep last time. Please advise if a 2 day prep is needed of if she would be ok with standard suprep.   Thank you

## 2023-10-31 ENCOUNTER — Ambulatory Visit (AMBULATORY_SURGERY_CENTER)

## 2023-10-31 VITALS — Ht 63.0 in | Wt 160.0 lb

## 2023-10-31 DIAGNOSIS — R112 Nausea with vomiting, unspecified: Secondary | ICD-10-CM

## 2023-10-31 DIAGNOSIS — Z860100 Personal history of colon polyps, unspecified: Secondary | ICD-10-CM

## 2023-10-31 MED ORDER — ONDANSETRON HCL 4 MG PO TABS: 4.0000 mg | ORAL_TABLET | ORAL | 0 refills | Status: AC

## 2023-10-31 MED ORDER — NA SULFATE-K SULFATE-MG SULF 17.5-3.13-1.6 GM/177ML PO SOLN
1.0000 | Freq: Once | ORAL | 0 refills | Status: AC
Start: 2023-10-31 — End: 2023-10-31

## 2023-10-31 NOTE — Progress Notes (Signed)
 No egg or soy allergy known to patient  No issues known to pt with past sedation with any surgeries or procedures Patient denies ever being told they had issues or difficulty with intubation  No FH of Malignant Hyperthermia Pt is not on diet pills Pt is not on  home 02  Pt is not on blood thinners  Pt denies issues with constipation  No A fib or A flutter Have any cardiac testing pending--No Pt can ambulate  Pt denies use of chewing tobacco Discussed diabetic I weight loss medication holds Discussed NSAID holds Checked BMI Pt instructed to use Single[employer].com or [employer] for a price reduction on prep  Pre visit completed

## 2023-11-05 ENCOUNTER — Telehealth: Payer: Self-pay | Admitting: Gastroenterology

## 2023-11-05 NOTE — Telephone Encounter (Signed)
 Inbound call from patient stating that she is scheduled to have a colonoscopy on 5/2 for a colonoscopy. Patient states that she was told about a coupon to use for her prep being sent in the mail. She states that she has be receiving messages on Mychart and from the pharmacy about picking up her prep and is wanting to discuss the coupon and when it will get to her. Please advise.

## 2023-11-05 NOTE — Telephone Encounter (Signed)
 Returned patient call.  Reached VM.   Left message to wait a few more days for mail to deliver, PV 5 days ago.  Left instructions how to create a coupon on [employer] site as well.   Instructed patient to call us back if she does not receive coupon by end of week or she is unable to access [employer] on her phone. Colonoscopy is 11/28/23.

## 2023-11-21 ENCOUNTER — Encounter: Payer: Self-pay | Admitting: Gastroenterology

## 2023-11-24 ENCOUNTER — Telehealth: Payer: Self-pay | Admitting: Gastroenterology

## 2023-11-24 NOTE — Telephone Encounter (Signed)
 Patient called and stated that she would like to speak to the Pre-visit nurse regarding her 5 day food she should avoid. Patient is requesting a call back. Patient is schedule for a procedure on May the 2 nd. Please advise.

## 2023-11-24 NOTE — Telephone Encounter (Signed)
 Returned patient call and clarified foods that were allowed in the 5 day window prior to colonoscopy.

## 2023-11-28 ENCOUNTER — Encounter: Payer: Self-pay | Admitting: Gastroenterology

## 2023-11-28 ENCOUNTER — Ambulatory Visit (AMBULATORY_SURGERY_CENTER): Admitting: Gastroenterology

## 2023-11-28 VITALS — BP 170/80 | HR 60 | Temp 97.7°F | Resp 12 | Ht 63.0 in | Wt 160.0 lb

## 2023-11-28 DIAGNOSIS — Z1211 Encounter for screening for malignant neoplasm of colon: Secondary | ICD-10-CM | POA: Diagnosis not present

## 2023-11-28 DIAGNOSIS — D122 Benign neoplasm of ascending colon: Secondary | ICD-10-CM

## 2023-11-28 DIAGNOSIS — Z860100 Personal history of colon polyps, unspecified: Secondary | ICD-10-CM

## 2023-11-28 DIAGNOSIS — K635 Polyp of colon: Secondary | ICD-10-CM

## 2023-11-28 DIAGNOSIS — K5730 Diverticulosis of large intestine without perforation or abscess without bleeding: Secondary | ICD-10-CM | POA: Diagnosis not present

## 2023-11-28 DIAGNOSIS — D124 Benign neoplasm of descending colon: Secondary | ICD-10-CM

## 2023-11-28 DIAGNOSIS — D123 Benign neoplasm of transverse colon: Secondary | ICD-10-CM

## 2023-11-28 MED ORDER — SODIUM CHLORIDE 0.9 % IV SOLN
500.0000 mL | Freq: Once | INTRAVENOUS | Status: DC
Start: 2023-11-28 — End: 2023-11-28

## 2023-11-28 NOTE — Op Note (Signed)
 [facility] Patient Name: Donna Conner Procedure Date: 11/28/2023 2:41 PM MRN: [id number] Endoscopist: Geralyn Knee E. Cherryl Corona , MD, [id number] Age: 73 Referring MD:  Date of Birth: 03/23/1951 Gender: Female Account #: [id number] Procedure:                Colonoscopy Indications:              Surveillance: Personal history of adenomatous                            polyps on last colonoscopy > 3 years ago, Last                            colonoscopy: April 2021 Medicines:                Monitored Anesthesia Care Procedure:                Pre-Anesthesia Assessment:                           - Prior to the procedure, a History and Physical                            was performed, and patient medications and                            allergies were reviewed. The patient's tolerance of                            previous anesthesia was also reviewed. The risks                            and benefits of the procedure and the sedation                            options and risks were discussed with the patient.                            All questions were answered, and informed consent                            was obtained. Prior Anticoagulants: The patient has                            taken no anticoagulant or antiplatelet agents. ASA                            Grade Assessment: II - A patient with mild systemic                            disease. After reviewing the risks and benefits,                            the patient was deemed in satisfactory condition to  undergo the procedure.                           After obtaining informed consent, the colonoscope                            was passed under direct vision. Throughout the                            procedure, the patient's blood pressure, pulse, and                            oxygen saturations were monitored continuously. The                            Olympus Scope [id number] was introduced  through the                            anus and advanced to the the cecum, identified by                            appendiceal orifice and ileocecal valve. The CF                            [id number] #[id number] was introduced through the anus and                            advanced to the sigmoid before being switched out                            for the pediatric colonoscope. The colonoscopy was                            performed with moderate difficulty due to                            restricted mobility of the colon and a tortuous                            colon. Successful completion of the procedure was                            aided by using manual pressure, withdrawing and                            reinserting the scope and withdrawing the scope and                            replacing with the pediatric endoscope. The patient                            tolerated the procedure well. The quality of the  bowel preparation was adequate. The ileocecal                            valve, appendiceal orifice, and rectum were                            photographed. The bowel preparation used was SUPREP                            via split dose instruction. Scope In: 3:04:46 PM Scope Out: 3:38:57 PM Scope Withdrawal Time: 0 hours 19 minutes 33 seconds  Total Procedure Duration: 0 hours 34 minutes 11 seconds  Findings:                 The perianal and digital rectal examinations were                            normal. Pertinent negatives include normal                            sphincter tone and no palpable rectal lesions.                           A 5 mm polyp was found in the transverse colon. The                            polyp was sessile. The polyp was removed with a                            cold snare. Resection and retrieval were complete.                            Estimated blood loss was minimal.                           A 10 mm polyp was found  in the ascending colon. The                            polyp was sessile. The polyp was removed with a                            cold snare. Resection and retrieval were complete.                            Estimated blood loss was minimal.                           A 7 mm polyp was found in the distal transverse                            colon. The polyp was semi-pedunculated. The polyp                            was removed  with a cold snare. Resection was                            complete, but the polyp tissue was not retrieved.                            Estimated blood loss was minimal.                           Many medium-mouthed and small-mouthed diverticula                            were found in the sigmoid colon. There was                            narrowing of the colon in association with the                            diverticular opening.                           The exam was otherwise normal throughout the                            examined colon.                           The retroflexed view of the distal rectum and anal                            verge was normal and showed no anal or rectal                            abnormalities. Complications:            No immediate complications. Estimated Blood Loss:     Estimated blood loss was minimal. Impression:               - One 5 mm polyp in the transverse colon, removed                            with a cold snare. Resected and retrieved.                           - One 10 mm polyp in the ascending colon, removed                            with a cold snare. Resected and retrieved.                           - One 7 mm polyp in the distal transverse colon,                            removed with a cold snare. Complete resection.  Polyp tissue not retrieved.                           - Moderate diverticulosis in the sigmoid colon.                            There was narrowing of the colon  in association                            with the diverticular opening.                           - The distal rectum and anal verge are normal on                            retroflexion view. Recommendation:           - Patient has a contact number available for                            emergencies. The signs and symptoms of potential                            delayed complications were discussed with the                            patient. Return to normal activities tomorrow.                            Written discharge instructions were provided to the                            patient.                           - Resume previous diet.                           - Continue present medications.                           - Await pathology results.                           - Repeat colonoscopy in 3 years for surveillance.                           - Subsequent colonoscopy should be performed with                            pediatric colonoscope or ultraslim colonoscope. Izzie Geers E. Cherryl Corona, MD 11/28/2023 3:52:57 PM This report has been signed electronically.

## 2023-11-28 NOTE — Progress Notes (Signed)
 Sedate, gd SR, tolerated procedure well, VSS, report to RN

## 2023-11-28 NOTE — Progress Notes (Signed)
 Called to room to assist during endoscopic procedure.  Patient ID and intended procedure confirmed with present staff. Received instructions for my participation in the procedure from the performing physician.

## 2023-11-28 NOTE — Progress Notes (Signed)
 [facility] History and Physical   Primary Care Physician:  Austine Lefort, MD   Reason for Procedure:   Colon cancer screening/history of colon polyps  Plan:    Surveillance colonoscopy     HPI: Donna Conner is a 73 y.o. female undergoing surveillance colonoscopy.  She has no family history of colon cancer and no chronic GI symptoms.   Her last colonoscopy was in April 2021 by Dr. Savannah Curlin and 4 small polyps (Tas) were removed.  A couple of small polyps were also removed in 2016.   Past Medical History:  Diagnosis Date   Anemia    Arthritis    right hand   Cancer (HCC)    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   Cataract    bilateral repair   Colon polyps    Hyperlipidemia    Prediabetes    Trochanteric bursitis of both hips 07/2017   received cortisone injections bilaterally   Trochanteric bursitis, left hip 07/2017    Past Surgical History:  Procedure Laterality Date   CATARACT EXTRACTION W/PHACO Left 08/07/2017   Procedure: CATARACT EXTRACTION PHACO AND INTRAOCULAR LENS PLACEMENT (IOC);  Surgeon: Rosa College, MD;  Location: [facility] ORS;  Service: Ophthalmology;  Laterality: Left;  Lot# [id number] US :   00:50.4 AP%:   13.4 CDE:    6.78   CATARACT EXTRACTION W/PHACO Right 08/28/2017   Procedure: CATARACT EXTRACTION PHACO AND INTRAOCULAR LENS PLACEMENT (IOC);  Surgeon: Rosa College, MD;  Location: [facility] ORS;  Service: Ophthalmology;  Laterality: Right;  US  00:28.3 AP% 8.8 CDE 2.48 Fluid Pack lot # [id number]   CERVICAL CONIZATION W/BX  1978   CHOLECYSTECTOMY     COLONOSCOPY  2016   SKIN CANCER DESTRUCTION  07/29/06   basal cell   TONSILLECTOMY AND ADENOIDECTOMY  1967    Prior to Admission medications   Medication Sig Start Date End Date Taking? Authorizing Provider  Calcium Carbonate-Vitamin D  (CALCIUM-VITAMIN D3 PO) Take 1 tablet by mouth daily.   Yes [provider]  Cyanocobalamin (VITAMIN B 12 PO) Take by mouth.   Yes [provider]  ondansetron  (ZOFRAN ) 4 MG tablet Take 1 tablet (4 mg total) by mouth as directed. Take one tablet 45 minutes to an hour before each bowel prep. 10/31/23  Yes Elois Hair, MD  acetaminophen (TYLENOL) 500 MG tablet Take 500-1,000 mg by mouth every 6 (six) hours as needed for moderate pain or headache.     [provider]  mometasone  (ELOCON ) 0.1 % cream Apply once daily as needed to rash. Do not use for more than 14 days consistently Patient not taking: Reported on 10/31/2023 03/13/23   Mardene Shake, FNP    Current Outpatient Medications  Medication Sig Dispense Refill   Calcium Carbonate-Vitamin D  (CALCIUM-VITAMIN D3 PO) Take 1 tablet by mouth daily.     Cyanocobalamin (VITAMIN B 12 PO) Take by mouth.     ondansetron  (ZOFRAN ) 4 MG tablet Take 1 tablet (4 mg total) by mouth as directed. Take one tablet 45 minutes to an hour before each bowel prep. 2 tablet 0   acetaminophen (TYLENOL) 500 MG tablet Take 500-1,000 mg by mouth every 6 (six) hours as needed for moderate pain or headache.      mometasone  (ELOCON ) 0.1 % cream Apply once daily as needed to rash. Do not use for more than 14 days consistently (Patient not taking: Reported on 10/31/2023) 15 g 1   Current Facility-Administered Medications  Medication Dose Route  Frequency Provider Last Rate Last Admin   0.9 %  sodium chloride  infusion  500 mL Intravenous Once Elois Hair, MD        Allergies as of 11/28/2023   (No Known Allergies)    Family History  Problem Relation Age of Onset   Colon cancer Neg Hx    Esophageal cancer Neg Hx    Rectal cancer Neg Hx    Stomach cancer Neg Hx    Colon polyps Neg Hx     Social History   Socioeconomic History   Marital status: Married    Spouse name: John   Number of children: 1   Years of education: Not on file   Highest education level: Associate degree: occupational, Scientist, product/process development, or vocational program  Occupational History   Not on file  Tobacco Use    Smoking status: Former    Current packs/day: 0.00    Types: Cigarettes    Quit date: 10/26/1993    Years since quitting: 30.1   Smokeless tobacco: Never  Vaping Use   Vaping status: Never Used  Substance and Sexual Activity   Alcohol use: No   Drug use: No   Sexual activity: Yes    Comment: married to [place], retired  Other Topics Concern   Not on file  Social History Narrative   1 son. 2 step sons   6 grandchildren   4 great grand children.   Social Drivers of Corporate investment banker Strain: Low Risk  (09/18/2023)   Overall Financial Resource Strain (CARDIA)    Difficulty of Paying Living Expenses: Not hard at all  Food Insecurity: No Food Insecurity (09/18/2023)   Hunger Vital Sign    Worried About Running Out of Food in the Last Year: Never true    Ran Out of Food in the Last Year: Never true  Transportation Needs: No Transportation Needs (09/18/2023)   PRAPARE - Administrator, Civil Service (Medical): No    Lack of Transportation (Non-Medical): No  Physical Activity: Insufficiently Active (09/18/2023)   Exercise Vital Sign    Days of Exercise per Week: 3 days    Minutes of Exercise per Session: 30 min  Stress: No Stress Concern Present (09/18/2023)   [employer] of Occupational Health - Occupational Stress Questionnaire    Feeling of Stress : Only a little  Social Connections: Socially Integrated (09/18/2023)   Social Connection and Isolation Panel [NHANES]    Frequency of Communication with Friends and Family: More than three times a week    Frequency of Social Gatherings with Friends and Family: More than three times a week    Attends Religious Services: More than 4 times per year    Active Member of [employer] or Organizations: Yes    Attends Engineer, structural: More than 4 times per year    Marital Status: Married  Catering manager Violence: Not At Risk (09/20/2021)   Humiliation, Afraid, Rape, and Kick questionnaire    Fear of Current or  Ex-Partner: No    Emotionally Abused: No    Physically Abused: No    Sexually Abused: No    Review of Systems:  All other review of systems negative except as mentioned in the HPI.  Physical Exam: Vital signs BP (!) 182/92   Pulse 75   Temp 97.7 F (36.5 C)   Ht 5\' 3"  (1.6 m)   Wt 160 lb (72.6 kg)   SpO2 100%   BMI 28.34 kg/m  General:   Alert,  Well-developed, well-nourished, pleasant and cooperative in NAD Airway:  Mallampati 1 Lungs:  Clear throughout to auscultation.   Heart:  Regular rate and rhythm; no murmurs, clicks, rubs,  or gallops. Abdomen:  Soft, nontender and nondistended. Normal bowel sounds.   Neuro/Psych:  Normal mood and affect. A and O x 3   Mouna Yager E. Cherryl Corona, MD [facility]

## 2023-11-28 NOTE — Progress Notes (Signed)
 Pt's states no medical or surgical changes since previsit or office visit.

## 2023-11-28 NOTE — Patient Instructions (Addendum)
Resume previous diet Continue present medications Await pathology results  Handouts/information given for polyps, diverticulosis  YOU HAD AN ENDOSCOPIC PROCEDURE TODAY AT THE [facility]:   Refer to the procedure report that was given to you for any specific questions about what was found during the examination.  If the procedure report does not answer your questions, please call your gastroenterologist to clarify.  If you requested that your care partner not be given the details of your procedure findings, then the procedure report has been included in a sealed envelope for you to review at your convenience later.  YOU SHOULD EXPECT: Some feelings of bloating in the abdomen. Passage of more gas than usual.  Walking can help get rid of the air that was put into your GI tract during the procedure and reduce the bloating. If you had a lower endoscopy (such as a colonoscopy or flexible sigmoidoscopy) you may notice spotting of blood in your stool or on the toilet paper. If you underwent a bowel prep for your procedure, you may not have a normal bowel movement for a few days.  Please Note:  You might notice some irritation and congestion in your nose or some drainage.  This is from the oxygen used during your procedure.  There is no need for concern and it should clear up in a day or so.  SYMPTOMS TO REPORT IMMEDIATELY:  Following lower endoscopy (colonoscopy):  Excessive amounts of blood in the stool  Significant tenderness or worsening of abdominal pains  Swelling of the abdomen that is new, acute  Fever of 100F or higher  For urgent or emergent issues, a gastroenterologist can be reached at any hour by calling [phone]. Do not use MyChart messaging for urgent concerns.    DIET:  We do recommend a small meal at first, but then you may proceed to your regular diet.  Drink plenty of fluids but you should avoid alcoholic beverages for 24 hours.  ACTIVITY:  You should plan to  take it easy for the rest of today and you should NOT DRIVE or use heavy machinery until tomorrow (because of the sedation medicines used during the test).    FOLLOW UP: Our staff will call the number listed on your records the next business day following your procedure.  We will call around 7:15- 8:00 am to check on you and address any questions or concerns that you may have regarding the information given to you following your procedure. If we do not reach you, we will leave a message.     If any biopsies were taken you will be contacted by phone or by letter within the next 1-3 weeks.  Please call us at [phone] if you have not heard about the biopsies in 3 weeks.    SIGNATURES/CONFIDENTIALITY: You and/or your care partner have signed paperwork which will be entered into your electronic medical record.  These signatures attest to the fact that that the information above on your After Visit Summary has been reviewed and is understood.  Full responsibility of the confidentiality of this discharge information lies with you and/or your care-partner. 

## 2023-11-29 ENCOUNTER — Telehealth: Payer: Self-pay | Admitting: Gastroenterology

## 2023-11-29 NOTE — Telephone Encounter (Signed)
 Patient contacted the on-call services yesterday evening around 9:00 PM with complaints of multiple large-volume bloody bowel movements.  I performed her colonoscopy earlier that day and removed 3 polyps.  There was no active bleeding following the polypectomies.  The patient was feeling well following her colonoscopy, but experienced sudden urge to defecate and passage of clot and red blood.  She had several episodes of large-volume bleeding from 8 PM to 9 PM, prompting her to call the on-call provider. She denied any symptoms of dizziness, weakness or lightheadedness.  I told the patient that she appeared to be having a post polypectomy bleed.  I informed her that these often will resolve on their own, but sometimes the bleeding could be severe and persistent and require endoscopic hemostasis.  I told her that going to the emergency room is the most prudent thing, but if she wanted to wait another half hour or so given that it has only been an hour since the bleeding started and she was feeling otherwise okay that this would be reasonable.  I told her that she could call me back if she had any further questions of her symptoms were changing.  I did not hear from her again overnight.  I called her this morning, and she tells me that she had 1 more bloody bowel movement about 30 minutes after we talked, but did not have any further bowel movements since then.  She slept well all night.  She has not had a bowel movement today.  She feels well today and had a regular breakfast.  She denies any symptoms of fatigue, weakness or lightheadedness.  I told her that it appears that her polypectomy bleed resolved spontaneously.  It is possible that she could bleed again, and if she is having severe and persistent bleeding, she should go to the emergency room as discussed previously.   Her husband is with her and can take her to the emergency room if needed.

## 2023-12-01 ENCOUNTER — Telehealth: Payer: Self-pay | Admitting: *Deleted

## 2023-12-01 NOTE — Telephone Encounter (Signed)
  Follow up Call-     11/28/2023    2:03 PM  Call back number  Post procedure Call Back phone  # [phone]  Permission to leave phone message Yes     Patient questions:  Do you have a fever, pain , or abdominal swelling? No. Pain Score  0 *  Have you tolerated food without any problems? Yes.    Have you been able to return to your normal activities? Yes.    Do you have any questions about your discharge instructions: Diet   No. Medications  No. Follow up visit  No.  Do you have questions or concerns about your Care? No.  Actions: * If pain score is 4 or above: No action needed, pain <4.

## 2023-12-03 LAB — SURGICAL PATHOLOGY

## 2023-12-07 ENCOUNTER — Encounter: Payer: Self-pay | Admitting: Gastroenterology

## 2023-12-07 NOTE — Progress Notes (Signed)
 Donna Conner,   The polyps that I removed during your recent procedure were completely benign but were proven to be "pre-cancerous" polyps that MAY have grown into cancers if they had not been removed.  Studies shows that at least 20% of women over age 75 and 30% of men over age 64 have pre-cancerous polyps.  Based on current nationally recognized surveillance guidelines, I recommend that you have a repeat colonoscopy in 3 years.   If you develop any new rectal bleeding, abdominal pain or significant bowel habit changes, please contact me before then.

## 2024-01-13 ENCOUNTER — Telehealth: Payer: Self-pay | Admitting: Family Medicine

## 2024-01-13 NOTE — Telephone Encounter (Signed)
 Left message for patient to return our call. We need to see about scheduling a diabetic retinal exam in office.

## 2024-03-19 ENCOUNTER — Telehealth: Admitting: Physician Assistant

## 2024-03-19 DIAGNOSIS — L259 Unspecified contact dermatitis, unspecified cause: Secondary | ICD-10-CM

## 2024-03-19 MED ORDER — PREDNISONE 10 MG (21) PO TBPK: ORAL_TABLET | Freq: Every day | ORAL | 0 refills | Status: AC

## 2024-03-19 NOTE — Progress Notes (Signed)
 E Visit for Rash  We are sorry that you are not feeling well. Here is how we plan to help!  Based on what you shared with me it looks like you have contact dermatitis.  Contact dermatitis is a skin rash caused by something that touches the skin and causes irritation or inflammation.  Your skin may be red, swollen, dry, cracked, and itch.  The rash should go away in a few days but can last a few weeks.  If you get a rash, it's important to figure out what caused it so the irritant can be avoided in the future.  I have prescribed a prednisone  taper to help your symptoms. Continue to use over the counter steroid cream.   HOME CARE:  Take cool showers and avoid direct sunlight. Apply cool compress or wet dressings. Take a bath in an oatmeal bath.  Sprinkle content of one Aveeno packet under running faucet with comfortably warm water.  Bathe for 15-20 minutes, 1-2 times daily.  Pat dry with a towel. Do not rub the rash. Use hydrocortisone cream. Take an antihistamine like Benadryl for widespread rashes that itch.  The adult dose of Benadryl is 25-50 mg by mouth 4 times daily. Caution:  This type of medication may cause sleepiness.  Do not drink alcohol, drive, or operate dangerous machinery while taking antihistamines.  Do not take these medications if you have prostate enlargement.  Read package instructions thoroughly on all medications that you take.  GET HELP RIGHT AWAY IF:  Symptoms don't go away after treatment. Severe itching that persists. If you rash spreads or swells. If you rash begins to smell. If it blisters and opens or develops a yellow-brown crust. You develop a fever. You have a sore throat. You become short of breath.  MAKE SURE YOU:  Understand these instructions. Will watch your condition. Will get help right away if you are not doing well or get worse.  Thank you for choosing an e-visit.  Your e-visit answers were reviewed by a board certified advanced clinical  practitioner to complete your personal care plan. Depending upon the condition, your plan could have included both over the counter or prescription medications.  Please review your pharmacy choice. Make sure the pharmacy is open so you can pick up prescription now. If there is a problem, you may contact your provider through [employer] and have the prescription routed to another pharmacy.  Your safety is important to us . If you have drug allergies check your prescription carefully.   For the next 24 hours you can use MyChart to ask questions about today's visit, request a non-urgent call back, or ask for a work or school excuse. You will get an email in the next two days asking about your experience. I hope that your e-visit has been valuable and will speed your recovery.   Approximately 5 minutes was spent documenting and reviewing patient's chart.

## 2024-03-25 ENCOUNTER — Telehealth: Admitting: Physician Assistant

## 2024-03-25 DIAGNOSIS — L237 Allergic contact dermatitis due to plants, except food: Secondary | ICD-10-CM

## 2024-03-25 NOTE — Progress Notes (Signed)
  Because of continued symptoms despite treatment virtually, I feel your condition warrants further evaluation and I recommend that you be seen in a face-to-face visit.   NOTE: There will be NO CHARGE for this E-Visit   If you are having a true medical emergency, please call 911.     For an urgent face to face visit, [facility] has multiple urgent care centers for your convenience.  Click the link below for the full list of locations and hours, walk-in wait times, appointment scheduling options and driving directions:  Urgent Care - [place], [place], [place], [place], [place], [place]  [place]     Your MyChart E-visit questionnaire answers were reviewed by a board certified advanced clinical practitioner to complete your personal care plan based on your specific symptoms.    Thank you for using e-Visits.

## 2024-07-06 ENCOUNTER — Other Ambulatory Visit: Payer: Self-pay | Admitting: Family Medicine

## 2024-07-06 DIAGNOSIS — Z1231 Encounter for screening mammogram for malignant neoplasm of breast: Secondary | ICD-10-CM

## 2024-08-20 ENCOUNTER — Ambulatory Visit
Admission: RE | Admit: 2024-08-20 | Discharge: 2024-08-20 | Disposition: A | Source: Ambulatory Visit | Attending: Family Medicine | Admitting: Family Medicine

## 2024-08-20 DIAGNOSIS — Z1231 Encounter for screening mammogram for malignant neoplasm of breast: Secondary | ICD-10-CM

## 2024-09-23 ENCOUNTER — Encounter: Payer: Medicare Other | Admitting: Family Medicine

## 2024-11-11 ENCOUNTER — Encounter: Admitting: Family Medicine
